# Patient Record
Sex: Male | Born: 1960
Health system: Southern US, Community
[De-identification: ages and names within clinical notes are randomized; demographics above are authoritative.]

## PROBLEM LIST (undated history)

## (undated) DIAGNOSIS — R109 Unspecified abdominal pain: Secondary | ICD-10-CM

## (undated) DIAGNOSIS — K644 Residual hemorrhoidal skin tags: Secondary | ICD-10-CM

## (undated) HISTORY — PX: OTHER SURGICAL HISTORY: SHX169

---

## 2011-11-14 ENCOUNTER — Encounter: Payer: Self-pay | Admitting: *Deleted

## 2011-11-14 ENCOUNTER — Emergency Department (HOSPITAL_BASED_OUTPATIENT_CLINIC_OR_DEPARTMENT_OTHER)
Admission: EM | Admit: 2011-11-14 | Discharge: 2011-11-14 | Disposition: A | Discharge status: Home / Self Care | Payer: BC Managed Care – PPO | Attending: Emergency Medicine | Admitting: Emergency Medicine

## 2011-11-14 DIAGNOSIS — E860 Dehydration: Secondary | ICD-10-CM | POA: Insufficient documentation

## 2011-11-14 DIAGNOSIS — R5381 Other malaise: Secondary | ICD-10-CM | POA: Insufficient documentation

## 2011-11-14 HISTORY — DX: Residual hemorrhoidal skin tags: K64.4

## 2011-11-14 LAB — CBC
MCH: 29.5 pg (ref 26.0–34.0)
MCHC: 36.5 g/dL — ABNORMAL HIGH (ref 30.0–36.0)
MCV: 81 fL (ref 78.0–100.0)
Platelets: 553 10*3/uL — ABNORMAL HIGH (ref 150–400)
RBC: 5.99 MIL/uL — ABNORMAL HIGH (ref 4.22–5.81)
RDW: 13.4 % (ref 11.5–15.5)

## 2011-11-14 LAB — DIFFERENTIAL
Basophils Absolute: 0 10*3/uL (ref 0.0–0.1)
Eosinophils Absolute: 0.6 10*3/uL (ref 0.0–0.7)
Lymphs Abs: 4.5 10*3/uL — ABNORMAL HIGH (ref 0.7–4.0)
Monocytes Absolute: 1.5 10*3/uL — ABNORMAL HIGH (ref 0.1–1.0)
Neutrophils Relative %: 69 % (ref 43–77)

## 2011-11-14 LAB — COMPREHENSIVE METABOLIC PANEL
ALT: 10 U/L (ref 0–53)
AST: 20 U/L (ref 0–37)
Albumin: 1 g/dL — ABNORMAL LOW (ref 3.5–5.2)
CO2: 25 mEq/L (ref 19–32)
Calcium: 6.6 mg/dL — ABNORMAL LOW (ref 8.4–10.5)
Creatinine, Ser: 1.6 mg/dL — ABNORMAL HIGH (ref 0.50–1.35)
GFR calc non Af Amer: 49 mL/min — ABNORMAL LOW (ref >90)
Sodium: 129 mEq/L — ABNORMAL LOW (ref 135–145)
Total Protein: 2.6 g/dL — ABNORMAL LOW (ref 6.0–8.3)

## 2011-11-14 MED ORDER — SODIUM CHLORIDE 0.9 % IV BOLUS (SEPSIS)
1000.0000 mL | Freq: Once | INTRAVENOUS | Status: AC
  Administered 2011-11-14: 1000 mL via INTRAVENOUS

## 2011-11-14 MED ORDER — ONDANSETRON 8 MG PO TBDP: 8.0000 mg | ORAL_TABLET | Freq: Three times a day (TID) | ORAL | Status: AC | PRN

## 2011-11-14 MED ORDER — SODIUM CHLORIDE 0.9 % IV BOLUS (SEPSIS): 1000.0000 mL | Freq: Once | INTRAVENOUS | Status: DC

## 2011-11-14 NOTE — ED Notes (Signed)
Vomiting and diarrhea through the night last pm last diarrhea stool today at 1000 states thinks he may be dehydrated as he has been having cramping in lower legs states this afternoon he had same type cramp in his face which concerned him. Denies pain at present

## 2011-11-14 NOTE — ED Provider Notes (Signed)
History   This chart was scribed for [name] Numbers, MD by Melba Coon. The patient was seen in room MH09/MH09 and the patient's care was started at 6:05PM.    CSN: [id number]  Arrival date & time 11/14/11  1712   First MD Initiated Contact with Patient 11/14/11 1752      Chief Complaint  Patient presents with  . Dehydration  . Fatigue    (Consider location/radiation/quality/duration/timing/severity/associated sxs/prior treatment) HPI [name] [name] is a 50 y.o. male who presents to the Emergency Department complaining of constant, moderate to severe cramping in his face and lower extremities with an onset 10:00AM this morning with associated fatigue and twitching. Pt also complains of dehydration due to vomiting, last time was last night, and diarrhea, last time was this morning. Pt used heating pads for cramping which alleviated the pain. IV fluids before exam also alleviated the pain. Pt has been recently suffering with hemorrhoids, but no recent bleeding. Swelling was present from hemorrhoids; pt used cortisone cream for swelling and heating pads which made it better. No past abdominal surgeries. Patient does have a history of IBS and reports that his symptoms are consistent with prior flares of this. He is here mainly because he was concerned about dehydration.  PCP: Dr. Bosie Clos      Past Medical History  Diagnosis Date  . Hemorrhoids, external     History reviewed. No pertinent past surgical history.  History reviewed. No pertinent family history.  History  Substance Use Topics  . Smoking status: Never Smoker   . Smokeless tobacco: Not on file  . Alcohol Use: No      Review of Systems 10 Systems reviewed and are negative for acute change except as noted in the HPI.  Allergies  Sunflowerseed oil; Other; and Sulfa antibiotics  Home Medications   Current Outpatient Rx  Name Route Sig Dispense Refill  . DIGESTIVE ENZYMES PO Oral Take 1 tablet by mouth 3 (three)  times daily.      Marland Kitchen HYDROCORTISONE 1 % EX OINT Topical Apply topically 2 (two) times daily.      Marland Kitchen POLYETHYLENE GLYCOL 3350 PO POWD Oral Take 17 g by mouth daily.      Marland Kitchen PROBIOTIC FORMULA PO Oral Take 1 tablet by mouth daily.      Marland Kitchen SALINE NASAL SPRAY 0.65 % NA SOLN Nasal Place 1 spray into the nose daily.        BP 150/99  Pulse 103  Temp(Src) 98.1 F (36.7 C) (Oral)  Resp 20  SpO2 100%  Physical Exam  Nursing note and vitals reviewed. Constitutional: He is oriented to person, place, and time. He appears well-developed and well-nourished. No distress.  HENT:  Head: Normocephalic and atraumatic.  Eyes: Conjunctivae and EOM are normal. Pupils are equal, round, and reactive to light.  Neck: Normal range of motion. No tracheal deviation present.  Cardiovascular: Normal rate, regular rhythm and normal heart sounds.   Pulmonary/Chest: Effort normal and breath sounds normal. No respiratory distress.  Abdominal: Soft. He exhibits no distension. There is tenderness. There is no rebound and no guarding.       Mild diffuse tenderness  Musculoskeletal: Normal range of motion. He exhibits no edema.  Neurological: He is alert and oriented to person, place, and time. No sensory deficit.  Skin: Skin is warm and dry. No rash noted.  Psychiatric: He has a normal mood and affect. His behavior is normal.    ED Course  Procedures (including critical care time)  DIAGNOSTIC STUDIES: Oxygen Saturation is 100% on room air, normal by my interpretation.    COORDINATION OF CARE:     Labs Reviewed  CBC - Abnormal; Notable for the following:    WBC 21.2 (*)    RBC 5.99 (*)    Hemoglobin 17.7 (*)    MCHC 36.5 (*)    Platelets 553 (*)    All other components within normal limits  DIFFERENTIAL - Abnormal; Notable for the following:    Neutro Abs 14.6 (*)    Lymphs Abs 4.5 (*)    Monocytes Absolute 1.5 (*)    All other components within normal limits  COMPREHENSIVE METABOLIC PANEL - Abnormal;  Notable for the following:    Sodium 129 (*)    Glucose, Bld 112 (*)    BUN 29 (*)    Creatinine, Ser 1.60 (*)    Calcium 6.6 (*)    Total Protein 2.6 (*)    Albumin 1.0 (*)    Total Bilirubin 0.2 (*)    GFR calc non Af Amer 49 (*)    GFR calc Af Amer 57 (*)    All other components within normal limits   No results found.   1. Dehydration       MDM  Patient was evaluated by myself. He did appear to be dehydrated. Patient improved with 2 L of normal saline IV bolus. He was also given Zofran IV. Patient was notified of his lab results. We discussed that he did have obvious dehydration. He also may have a leukocytosis related to this as well as his IBS flare. The patient definitely does not have inflammatory bowel disease. Patient I discussed that he did not have abdominal pain following rehydration. He was aware that he had a leukocytosis. Based on her discussion the patient preferred no further testing including imaging. He was comfortable with plan for discharge home with a prescription for Zofran and instructions to drink lots of fluids. Patient had a creatinine of 1.6 and no prior values were available. Patient was hyponatremic but this was prior to any rehydration. Patient preferred not to receive his third liter of normal saline IV bolus. Patient was told if he had any worsening of his symptoms or other concerns he was welcome to return. Also he reported that his hemorrhoids her baseline. I did advise using Epsom salts to assess for the symptoms. He preferred not to have rectal exam today given his pain.  I personally performed the services described in this documentation, which was scribed in my presence. The recorded information has been reviewed and considered.          [name] Numbers, MD 11/14/11 2233

## 2011-11-18 DIAGNOSIS — E8809 Other disorders of plasma-protein metabolism, not elsewhere classified: Secondary | ICD-10-CM | POA: Insufficient documentation

## 2011-11-18 DIAGNOSIS — R609 Edema, unspecified: Secondary | ICD-10-CM | POA: Insufficient documentation

## 2011-11-18 DIAGNOSIS — E871 Hypo-osmolality and hyponatremia: Secondary | ICD-10-CM | POA: Insufficient documentation

## 2011-11-18 DIAGNOSIS — M7989 Other specified soft tissue disorders: Secondary | ICD-10-CM | POA: Insufficient documentation

## 2011-11-18 DIAGNOSIS — Z79899 Other long term (current) drug therapy: Secondary | ICD-10-CM | POA: Insufficient documentation

## 2011-11-19 ENCOUNTER — Encounter (HOSPITAL_BASED_OUTPATIENT_CLINIC_OR_DEPARTMENT_OTHER): Payer: Self-pay | Admitting: Emergency Medicine

## 2011-11-19 ENCOUNTER — Emergency Department (HOSPITAL_BASED_OUTPATIENT_CLINIC_OR_DEPARTMENT_OTHER)
Admission: EM | Admit: 2011-11-19 | Discharge: 2011-11-19 | Disposition: A | Discharge status: Home / Self Care | Payer: BC Managed Care – PPO | Attending: Emergency Medicine | Admitting: Emergency Medicine

## 2011-11-19 DIAGNOSIS — E8809 Other disorders of plasma-protein metabolism, not elsewhere classified: Secondary | ICD-10-CM

## 2011-11-19 DIAGNOSIS — E871 Hypo-osmolality and hyponatremia: Secondary | ICD-10-CM

## 2011-11-19 DIAGNOSIS — R609 Edema, unspecified: Secondary | ICD-10-CM

## 2011-11-19 LAB — COMPREHENSIVE METABOLIC PANEL
ALT: 17 U/L (ref 0–53)
Alkaline Phosphatase: 41 U/L (ref 39–117)
BUN: 23 mg/dL (ref 6–23)
CO2: 25 mEq/L (ref 19–32)
GFR calc Af Amer: 73 mL/min — ABNORMAL LOW (ref >90)
GFR calc non Af Amer: 63 mL/min — ABNORMAL LOW (ref >90)
Glucose, Bld: 90 mg/dL (ref 70–99)
Potassium: 3.8 mEq/L (ref 3.5–5.1)
Sodium: 129 mEq/L — ABNORMAL LOW (ref 135–145)

## 2011-11-19 LAB — DIFFERENTIAL
Basophils Relative: 0 % (ref 0–1)
Eosinophils Relative: 30 % — ABNORMAL HIGH (ref 0–5)
Lymphocytes Relative: 29 % (ref 12–46)
Monocytes Absolute: 1 10*3/uL (ref 0.1–1.0)
Neutro Abs: 6.8 10*3/uL (ref 1.7–7.7)
Neutrophils Relative %: 36 % — ABNORMAL LOW (ref 43–77)

## 2011-11-19 LAB — CBC
HCT: 39.6 % (ref 39.0–52.0)
Hemoglobin: 14.3 g/dL (ref 13.0–17.0)
MCH: 29.4 pg (ref 26.0–34.0)
RBC: 4.87 MIL/uL (ref 4.22–5.81)

## 2011-11-19 LAB — URINALYSIS, ROUTINE W REFLEX MICROSCOPIC
Bilirubin Urine: NEGATIVE
Hgb urine dipstick: NEGATIVE
Ketones, ur: NEGATIVE mg/dL
Protein, ur: NEGATIVE mg/dL
Urobilinogen, UA: 0.2 mg/dL (ref 0.0–1.0)

## 2011-11-19 LAB — PRO B NATRIURETIC PEPTIDE: Pro B Natriuretic peptide (BNP): 108.5 pg/mL (ref 0–125)

## 2011-11-19 MED ORDER — FUROSEMIDE 40 MG PO TABS
40.0000 mg | ORAL_TABLET | Freq: Once | ORAL | Status: AC
  Administered 2011-11-19: 40 mg via ORAL
  Filled 2011-11-19: qty 1

## 2011-11-19 NOTE — ED Provider Notes (Signed)
History     CSN: [id number]  Arrival date & time 11/18/11  2351   First MD Initiated Contact with Patient 11/19/11 0147      Chief Complaint  Patient presents with  . Leg Swelling  . Groin Swelling    (Consider location/radiation/quality/duration/timing/severity/associated sxs/prior treatment) HPI  50yoM presents with bilateral lower shin edema. The patient states that he woke up this morning with some swelling in his bilateral lower extremities as well as his penis. He states that the swelling seemed to improve and then got worse as the day progressed. No history of similar. He was recently seen in the emergency department for diarrhea and was given 2 L of IV fluid at that time. He states that he has been drinking "a lot" of fluid and has been on a liquid diet. Has also had multiple meals consisting of canned soups and broth since his discharge. He denies chest pain, shortness of breath. He was noted to have a creatinine of 1.6 during last evaluation. Denies h/o VTE in self or family. No recent hosp/surg/immob. No h/o cancer. Denies exogenous hormone use. Denies h/o CHF.    ED Notes, ED Provider Notes from 11/18/11 0000 to 11/19/11 00:02:44       Bufford Buttner, RN 11/19/2011 00:01      Pt c/o edema to lower extremities bilaterally and swelling in genital area. Pt was seen here 12/24 for dehydration and given IV fluids. Pt states swelling started today.     Past Medical History  Diagnosis Date  . Hemorrhoids, external     History reviewed. No pertinent past surgical history.  No family history on file.  History  Substance Use Topics  . Smoking status: Never Smoker   . Smokeless tobacco: Not on file  . Alcohol Use: No    Review of Systems  All other systems reviewed and are negative.  except as noted HPI   Allergies  Sunflowerseed oil; Other; and Sulfa antibiotics  Home Medications   Current Outpatient Rx  Name Route Sig Dispense Refill  . DIGESTIVE  ENZYMES PO Oral Take 1 tablet by mouth 3 (three) times daily.      Marland Kitchen HYDROCORTISONE 1 % EX OINT Topical Apply topically 2 (two) times daily.      Marland Kitchen ONDANSETRON 8 MG PO TBDP Oral Take 1 tablet (8 mg total) by mouth every 8 (eight) hours as needed for nausea. 20 tablet 0  . POLYETHYLENE GLYCOL 3350 PO POWD Oral Take 17 g by mouth daily.      Marland Kitchen PROBIOTIC FORMULA PO Oral Take 1 tablet by mouth daily.      Marland Kitchen SALINE NASAL SPRAY 0.65 % NA SOLN Nasal Place 1 spray into the nose daily.        BP 125/78  Pulse 65  Temp(Src) 97.9 F (36.6 C) (Oral)  Resp 18  SpO2 100%  Physical Exam  Nursing note and vitals reviewed. Constitutional: He is oriented to person, place, and time. He appears well-developed and well-nourished. No distress.  HENT:  Head: Atraumatic.  Mouth/Throat: Oropharynx is clear and moist.  Eyes: Conjunctivae are normal. Pupils are equal, round, and reactive to light.  Neck: Neck supple.  Cardiovascular: Normal rate, regular rhythm, normal heart sounds and intact distal pulses.  Exam reveals no gallop and no friction rub.   No murmur heard. Pulmonary/Chest: Effort normal. No respiratory distress. He has no wheezes. He has no rales.  Abdominal: Soft. Bowel sounds are normal. There is no tenderness. There  is no rebound and no guarding.  Genitourinary:       Minimal swelling noted penis and scrotum bilaterally No scrotal tenderness to palpation or erythema  Musculoskeletal: Normal range of motion. He exhibits edema. He exhibits no tenderness.       3+ pitting edema below the knee bilateral lotion these 1+ pitting edema bilateral thighs  Neurological: He is alert and oriented to person, place, and time.  Skin: Skin is warm and dry.  Psychiatric: He has a normal mood and affect.    ED Course  Procedures (including critical care time)  Labs Reviewed  CBC - Abnormal; Notable for the following:    WBC 19.0 (*)    MCHC 36.1 (*)    All other components within normal limits    DIFFERENTIAL - Abnormal; Notable for the following:    Neutrophils Relative 36 (*)    Eosinophils Relative 30 (*)    Lymphs Abs 5.5 (*)    Eosinophils Absolute 5.7 (*)    All other components within normal limits  COMPREHENSIVE METABOLIC PANEL - Abnormal; Notable for the following:    Sodium 129 (*)    Calcium 7.2 (*)    Total Protein 3.1 (*)    Albumin 1.3 (*)    GFR calc non Af Amer 63 (*)    GFR calc Af Amer 73 (*)    All other components within normal limits  PRO B NATRIURETIC PEPTIDE  URINALYSIS, ROUTINE W REFLEX MICROSCOPIC   No results found.   1. Edema   2. Hypoalbuminemia   3. Hyponatremia     MDM  50yoM with apparent fluid overload. He does not have congestive heart failure history or by labs. He does have hypoalbuminemia and mild hyponatremia question dilutional hypernatremia. Low susp SIADH. In light of his recent boluses of normal saline and largely increased sodium intake. His diarrhea is actually now resolved. He appears well. He had leukocytosis when he was seen here on the 24th to 21,000 which is ersistent but now dec to 19,000. His creatinine has improved from 1.6-1.3. His sodium is 129 which is stable from previous. Albumin slightly higher than previous. No Protein in his urine. The patient appears well here. He states that his symptoms are actually resolving from earlier today. He was given by mouth Lasix and discuss follow up with his primary care physician for hypoalbuminemia as well as the edema.        Forbes Cellar, MD 11/19/11 [phone]

## 2011-11-19 NOTE — ED Notes (Signed)
Pt c/o edema to lower extremities bilaterally and swelling in genital area. Pt was seen here 12/24 for dehydration and given IV fluids. Pt states swelling started today.

## 2011-11-30 ENCOUNTER — Other Ambulatory Visit (HOSPITAL_BASED_OUTPATIENT_CLINIC_OR_DEPARTMENT_OTHER): Payer: Self-pay | Admitting: Family Medicine

## 2011-11-30 DIAGNOSIS — R197 Diarrhea, unspecified: Secondary | ICD-10-CM

## 2011-12-01 ENCOUNTER — Ambulatory Visit (HOSPITAL_BASED_OUTPATIENT_CLINIC_OR_DEPARTMENT_OTHER): Payer: Self-pay

## 2011-12-01 ENCOUNTER — Other Ambulatory Visit (HOSPITAL_BASED_OUTPATIENT_CLINIC_OR_DEPARTMENT_OTHER): Payer: Self-pay | Admitting: Family Medicine

## 2011-12-01 ENCOUNTER — Ambulatory Visit (HOSPITAL_BASED_OUTPATIENT_CLINIC_OR_DEPARTMENT_OTHER)
Admission: RE | Admit: 2011-12-01 | Discharge: 2011-12-01 | Disposition: A | Discharge status: Home / Self Care | Payer: BC Managed Care – PPO | Source: Ambulatory Visit | Attending: Family Medicine | Admitting: Family Medicine

## 2011-12-01 DIAGNOSIS — R197 Diarrhea, unspecified: Secondary | ICD-10-CM

## 2011-12-01 DIAGNOSIS — R109 Unspecified abdominal pain: Secondary | ICD-10-CM | POA: Insufficient documentation

## 2011-12-01 DIAGNOSIS — R188 Other ascites: Secondary | ICD-10-CM | POA: Insufficient documentation

## 2011-12-01 DIAGNOSIS — J90 Pleural effusion, not elsewhere classified: Secondary | ICD-10-CM

## 2011-12-01 DIAGNOSIS — J9819 Other pulmonary collapse: Secondary | ICD-10-CM

## 2011-12-01 DIAGNOSIS — R933 Abnormal findings on diagnostic imaging of other parts of digestive tract: Secondary | ICD-10-CM | POA: Insufficient documentation

## 2011-12-01 MED ORDER — IOHEXOL 300 MG/ML  SOLN
100.0000 mL | Freq: Once | INTRAMUSCULAR | Status: AC | PRN
  Administered 2011-12-01: 100 mL via INTRAVENOUS

## 2012-10-17 ENCOUNTER — Other Ambulatory Visit (HOSPITAL_BASED_OUTPATIENT_CLINIC_OR_DEPARTMENT_OTHER): Payer: Self-pay | Admitting: Internal Medicine

## 2012-10-23 ENCOUNTER — Other Ambulatory Visit: Payer: Self-pay | Admitting: Internal Medicine

## 2012-10-23 DIAGNOSIS — R109 Unspecified abdominal pain: Secondary | ICD-10-CM

## 2012-10-26 ENCOUNTER — Emergency Department (HOSPITAL_COMMUNITY)
Admission: EM | Admit: 2012-10-26 | Discharge: 2012-10-26 | Discharge status: Against Medical Advice | Payer: BC Managed Care – PPO | Attending: Emergency Medicine | Admitting: Emergency Medicine

## 2012-10-26 ENCOUNTER — Ambulatory Visit
Admission: RE | Admit: 2012-10-26 | Discharge: 2012-10-26 | Disposition: A | Discharge status: Home / Self Care | Payer: BC Managed Care – PPO | Source: Ambulatory Visit | Attending: Internal Medicine | Admitting: Internal Medicine

## 2012-10-26 ENCOUNTER — Encounter (HOSPITAL_COMMUNITY): Payer: Self-pay | Admitting: *Deleted

## 2012-10-26 DIAGNOSIS — E860 Dehydration: Secondary | ICD-10-CM | POA: Insufficient documentation

## 2012-10-26 DIAGNOSIS — R112 Nausea with vomiting, unspecified: Secondary | ICD-10-CM | POA: Insufficient documentation

## 2012-10-26 DIAGNOSIS — R197 Diarrhea, unspecified: Secondary | ICD-10-CM | POA: Insufficient documentation

## 2012-10-26 DIAGNOSIS — R4182 Altered mental status, unspecified: Secondary | ICD-10-CM | POA: Insufficient documentation

## 2012-10-26 DIAGNOSIS — R109 Unspecified abdominal pain: Secondary | ICD-10-CM

## 2012-10-26 DIAGNOSIS — F29 Unspecified psychosis not due to a substance or known physiological condition: Secondary | ICD-10-CM | POA: Insufficient documentation

## 2012-10-26 MED ORDER — IOHEXOL 300 MG/ML  SOLN
100.0000 mL | Freq: Once | INTRAMUSCULAR | Status: AC | PRN
  Administered 2012-10-26: 100 mL via INTRAVENOUS

## 2012-10-26 NOTE — ED Notes (Signed)
Pt states today had an abdominal CT scan d/t abdominal problems x 1 year, after drinking the contrast and after scan pt started repeating himself, had nausea/vomiting and diarrhea. Pt states he has been feeling confused.

## 2012-10-26 NOTE — ED Notes (Signed)
Being treated by MD in [facility] for abdominal issues.  Wife states he had a CT abdomen done today.  Pt has been vomiting, repeating himself and feels dehydrated.  Has been having diarrhea as well.  Pt appears a bit ashy and has chills at triage window,.

## 2012-10-26 NOTE — ED Notes (Signed)
Pt states that he drank 3 bottles of water and had a snack while waiting and now feels much better.  No longer wishes to wait and is leaving now.

## 2013-08-19 ENCOUNTER — Encounter (HOSPITAL_BASED_OUTPATIENT_CLINIC_OR_DEPARTMENT_OTHER): Payer: Self-pay

## 2013-08-19 ENCOUNTER — Emergency Department (HOSPITAL_BASED_OUTPATIENT_CLINIC_OR_DEPARTMENT_OTHER): Payer: BC Managed Care – PPO

## 2013-08-19 ENCOUNTER — Inpatient Hospital Stay (HOSPITAL_BASED_OUTPATIENT_CLINIC_OR_DEPARTMENT_OTHER)
Admission: EM | Admit: 2013-08-19 | Discharge: 2013-08-21 | DRG: 183 | Disposition: A | Discharge status: Home / Self Care | Payer: BC Managed Care – PPO | Attending: Internal Medicine | Admitting: Internal Medicine

## 2013-08-19 DIAGNOSIS — R109 Unspecified abdominal pain: Secondary | ICD-10-CM

## 2013-08-19 DIAGNOSIS — IMO0002 Reserved for concepts with insufficient information to code with codable children: Secondary | ICD-10-CM

## 2013-08-19 DIAGNOSIS — G8929 Other chronic pain: Secondary | ICD-10-CM | POA: Diagnosis present

## 2013-08-19 DIAGNOSIS — R066 Hiccough: Secondary | ICD-10-CM | POA: Diagnosis present

## 2013-08-19 DIAGNOSIS — Z91018 Allergy to other foods: Secondary | ICD-10-CM

## 2013-08-19 DIAGNOSIS — Z91010 Allergy to peanuts: Secondary | ICD-10-CM

## 2013-08-19 DIAGNOSIS — Z882 Allergy status to sulfonamides status: Secondary | ICD-10-CM

## 2013-08-19 DIAGNOSIS — K9049 Malabsorption due to intolerance, not elsewhere classified: Secondary | ICD-10-CM | POA: Diagnosis present

## 2013-08-19 DIAGNOSIS — E860 Dehydration: Secondary | ICD-10-CM | POA: Diagnosis present

## 2013-08-19 DIAGNOSIS — K9089 Other intestinal malabsorption: Principal | ICD-10-CM | POA: Diagnosis present

## 2013-08-19 DIAGNOSIS — E8809 Other disorders of plasma-protein metabolism, not elsewhere classified: Secondary | ICD-10-CM | POA: Diagnosis present

## 2013-08-19 DIAGNOSIS — I890 Lymphedema, not elsewhere classified: Secondary | ICD-10-CM | POA: Diagnosis present

## 2013-08-19 HISTORY — DX: Unspecified abdominal pain: R10.9

## 2013-08-19 LAB — COMPREHENSIVE METABOLIC PANEL
AST: 27 U/L (ref 0–37)
Albumin: 2.3 g/dL — ABNORMAL LOW (ref 3.5–5.2)
BUN: 12 mg/dL (ref 6–23)
Calcium: 8.7 mg/dL (ref 8.4–10.5)
Chloride: 103 mEq/L (ref 96–112)
Creatinine, Ser: 1 mg/dL (ref 0.50–1.35)
Glucose, Bld: 129 mg/dL — ABNORMAL HIGH (ref 70–99)
Total Bilirubin: 0.6 mg/dL (ref 0.3–1.2)
Total Protein: 4.9 g/dL — ABNORMAL LOW (ref 6.0–8.3)

## 2013-08-19 LAB — URINE MICROSCOPIC-ADD ON

## 2013-08-19 LAB — CBC WITH DIFFERENTIAL/PLATELET
Basophils Absolute: 0 10*3/uL (ref 0.0–0.1)
Basophils Relative: 0 % (ref 0–1)
Eosinophils Absolute: 0 10*3/uL (ref 0.0–0.7)
Eosinophils Relative: 0 % (ref 0–5)
HCT: 41.3 % (ref 39.0–52.0)
Hemoglobin: 14.1 g/dL (ref 13.0–17.0)
Lymphs Abs: 1.4 10*3/uL (ref 0.7–4.0)
MCH: 28.3 pg (ref 26.0–34.0)
MCHC: 34.1 g/dL (ref 30.0–36.0)
Monocytes Absolute: 0.5 10*3/uL (ref 0.1–1.0)
Monocytes Relative: 4 % (ref 3–12)
Neutro Abs: 13.3 10*3/uL — ABNORMAL HIGH (ref 1.7–7.7)
RDW: 13.8 % (ref 11.5–15.5)
WBC: 15.3 10*3/uL — ABNORMAL HIGH (ref 4.0–10.5)

## 2013-08-19 LAB — URINALYSIS, ROUTINE W REFLEX MICROSCOPIC
Glucose, UA: NEGATIVE mg/dL
Ketones, ur: 40 mg/dL — AB
Leukocytes, UA: NEGATIVE
Protein, ur: 100 mg/dL — AB
Specific Gravity, Urine: 1.019 (ref 1.005–1.030)
Urobilinogen, UA: 0.2 mg/dL (ref 0.0–1.0)
pH: 8.5 — ABNORMAL HIGH (ref 5.0–8.0)

## 2013-08-19 LAB — LIPASE, BLOOD: Lipase: 42 U/L (ref 11–59)

## 2013-08-19 MED ORDER — PROMETHAZINE HCL 25 MG/ML IJ SOLN
INTRAMUSCULAR | Status: AC
  Filled 2013-08-19: qty 1

## 2013-08-19 MED ORDER — METOCLOPRAMIDE HCL 5 MG/ML IJ SOLN
10.0000 mg | Freq: Once | INTRAMUSCULAR | Status: AC
  Administered 2013-08-19: 10 mg via INTRAVENOUS

## 2013-08-19 MED ORDER — PROMETHAZINE HCL 25 MG/ML IJ SOLN: 25.0000 mg | Freq: Once | INTRAMUSCULAR | Status: DC

## 2013-08-19 MED ORDER — PROMETHAZINE HCL 25 MG/ML IJ SOLN
12.5000 mg | Freq: Once | INTRAMUSCULAR | Status: AC
  Administered 2013-08-19: 12.5 mg via INTRAVENOUS

## 2013-08-19 MED ORDER — IOHEXOL 300 MG/ML  SOLN: 100.0000 mL | Freq: Once | INTRAMUSCULAR | Status: AC | PRN

## 2013-08-19 MED ORDER — MORPHINE SULFATE 4 MG/ML IJ SOLN
4.0000 mg | Freq: Once | INTRAMUSCULAR | Status: AC
  Administered 2013-08-19: 4 mg via INTRAVENOUS
  Filled 2013-08-19: qty 1

## 2013-08-19 MED ORDER — ONDANSETRON HCL 4 MG/2ML IJ SOLN
4.0000 mg | Freq: Once | INTRAMUSCULAR | Status: AC
  Administered 2013-08-19: 4 mg via INTRAVENOUS
  Filled 2013-08-19: qty 2

## 2013-08-19 MED ORDER — HYDROMORPHONE HCL PF 1 MG/ML IJ SOLN
1.0000 mg | Freq: Once | INTRAMUSCULAR | Status: DC
  Filled 2013-08-19: qty 1

## 2013-08-19 MED ORDER — SODIUM CHLORIDE 0.9 % IV SOLN
INTRAVENOUS | Status: AC
  Administered 2013-08-19: 22:00:00 via INTRAVENOUS

## 2013-08-19 MED ORDER — METOCLOPRAMIDE HCL 5 MG/ML IJ SOLN
INTRAMUSCULAR | Status: AC
  Filled 2013-08-19: qty 2

## 2013-08-19 MED ORDER — ONDANSETRON HCL 4 MG/2ML IJ SOLN: 4.0000 mg | Freq: Three times a day (TID) | INTRAMUSCULAR | Status: DC | PRN

## 2013-08-19 MED ORDER — SODIUM CHLORIDE 0.9 % IV BOLUS (SEPSIS)
1000.0000 mL | Freq: Once | INTRAVENOUS | Status: AC
  Administered 2013-08-19 (×2): 1000 mL via INTRAVENOUS

## 2013-08-19 MED ORDER — FENTANYL CITRATE 0.05 MG/ML IJ SOLN
50.0000 ug | Freq: Once | INTRAMUSCULAR | Status: AC
  Administered 2013-08-19: 50 ug via INTRAVENOUS
  Filled 2013-08-19: qty 2

## 2013-08-19 MED ORDER — IOHEXOL 300 MG/ML  SOLN
50.0000 mL | Freq: Once | INTRAMUSCULAR | Status: AC | PRN
  Administered 2013-08-19: 50 mL via ORAL

## 2013-08-19 NOTE — ED Notes (Signed)
Pt reports abdominal pain and vomiting since this morning.

## 2013-08-19 NOTE — ED Provider Notes (Signed)
CSN: [id number]     Arrival date & time 08/19/13  1621 History  This chart was scribed for Hilario Quarry, MD by Quintella Reichert, ED scribe.  This patient was seen in room MH12/MH12 and the patient's care was started at 5:59 PM.  Chief Complaint  Patient presents with  . Abdominal Pain  . Emesis    The history is provided by the patient. No language interpreter was used.    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  Pt localizes pain to upper abdomen and describes it as a moderate-to-severe, waxing-and-waning "clenching" pain.  He also complains of an episode of bilious emesis this afternoon.  He denies any other emesis but does have some belching and associated nausea.  He was able to eat half of a bagel this morning without vomiting.   He has not had anything else to eat and has been drinking very little.  He had a normal BM several hours ago.  Pt states he has experienced similar episodes recurrently for some time but his current episode is more severe and persistent.  He notes that his flare-ups are usually caused by eating fatty foods and are typically controlled by diet.  He denies any recent lapses in his diet.  He also medicates regularly with Prilosec.  He denies any other chronic medical conditions or regular medication usage.  He is allergic to Sulfa.  He denies alcohol or tobacco use.   He denies h/o kidney stones   PCP is Dr. Marinda Elk GI is Dr. Loreli Dollar at [facility]   Past Medical History  Diagnosis Date  . Hemorrhoids, external   . Abdominal pain     Past Surgical History  Procedure Laterality Date  . Laproscopy      No family history on file.   History  Substance Use Topics  . Smoking status: Never Smoker   . Smokeless tobacco: Never Used  . Alcohol Use: No     Review of  Systems  Gastrointestinal: Positive for vomiting and abdominal pain.  All other systems reviewed and are negative.     Allergies  Sunflowerseed oil; Other; and Sulfa antibiotics  Home Medications   Current Outpatient Rx  Name  Route  Sig  Dispense  Refill  . omeprazole (PRILOSEC) 20 MG capsule   Oral   Take 20 mg by mouth daily.          BP 152/95  Pulse 72  Resp 18  Ht 5\' 10"  (1.778 m)  Wt 140 lb (63.504 kg)  BMI 20.09 kg/m2  SpO2 100%  Physical Exam  Nursing note and vitals reviewed. Constitutional: He is oriented to person, place, and time. He appears well-developed and well-nourished.  HENT:  Head: Normocephalic and atraumatic.  Right Ear: External ear normal.  Left Ear: External ear normal.  Nose: Nose normal.  Mouth/Throat: Oropharynx is clear and moist.  Eyes: Conjunctivae and EOM are normal. Pupils are equal, round, and reactive to light.  Neck: Normal range of motion. Neck supple.  Cardiovascular: Normal rate, regular rhythm, normal heart sounds and intact distal pulses.   Pulmonary/Chest: Effort normal and breath sounds normal. No respiratory distress. He has no wheezes. He exhibits no tenderness.  Abdominal: Soft. Bowel sounds are normal. He exhibits no distension and no mass. There is tenderness. There is no  guarding.  Diffuse tenderness to abdomen, more to epigastrium, RUQ and RLQ  Musculoskeletal: Normal range of motion.  Neurological: He is alert and oriented to person, place, and time. He has normal reflexes. He exhibits normal muscle tone. Coordination normal.  Skin: Skin is warm and dry.  Psychiatric: He has a normal mood and affect. His behavior is normal. Judgment and thought content normal.    ED Course  Procedures (including critical care time)  DIAGNOSTIC STUDIES: Oxygen Saturation is 100% on room air, normal by my interpretation.    COORDINATION OF CARE: 6:07 PM-Discussed treatment plan which includes pain medication, anti-emetics, labs  and imaging with pt at bedside and pt agreed to plan.    Labs Review Labs Reviewed  CBC WITH DIFFERENTIAL - Abnormal; Notable for the following:    WBC 15.3 (*)    Platelets 498 (*)    Neutrophils Relative % 87 (*)    Neutro Abs 13.3 (*)    Lymphocytes Relative 9 (*)    All other components within normal limits  COMPREHENSIVE METABOLIC PANEL - Abnormal; Notable for the following:    Glucose, Bld 129 (*)    Total Protein 4.9 (*)    Albumin 2.3 (*)    GFR calc non Af Amer 85 (*)    All other components within normal limits  URINALYSIS, ROUTINE W REFLEX MICROSCOPIC - Abnormal; Notable for the following:    pH 8.5 (*)    Ketones, ur 40 (*)    Protein, ur 100 (*)    All other components within normal limits  URINE MICROSCOPIC-ADD ON - Abnormal; Notable for the following:    Bacteria, UA FEW (*)    Casts HYALINE CASTS (*)    All other components within normal limits  URINE CULTURE  LIPASE, BLOOD    Imaging Review Ct Abdomen Pelvis W Contrast  08/19/2013   CLINICAL DATA:  Abdominal pain.  History of Waldmanns disease.  EXAM: CT ABDOMEN AND PELVIS WITH CONTRAST  TECHNIQUE: Multidetector CT imaging of the abdomen and pelvis was performed using the standard protocol following bolus administration of intravenous contrast.  CONTRAST:  50mL OMNIPAQUE IOHEXOL 300 MG/ML  SOLN  COMPARISON:  CT scan 10/26/2012.  FINDINGS: The lung bases are clear. The heart is normal in size. No pericardial effusion. Small hiatal hernia is noted.  Mild diffuse fatty infiltration of the liver with a few small scattered stable hepatic cysts. No worrisome hepatic lesions or intrahepatic biliary dilatation. The portal and hepatic veins are patent. The gallbladder is normal. No common bile duct dilatation. The pancreas is normal. The spleen is normal. The adrenal glands and kidneys are normal.  The stomach and duodenum are unremarkable. The ileal loops of small bowel demonstrate fairly significant dilatation with wall  thickening and submucosal edema. No findings for obstruction. The jejunal loops appear relatively normal with normal mucosal fold pattern. There is also moderate abdominal and pelvic ascites and this may suggest hypoalbuminemia. The colon is unremarkable. Scattered mesenteric and retroperitoneal lymph nodes but no mass or adenopathy. A small periumbilical hernia is noted containing fat. The bladder, prostate gland and seminal vesicles are unremarkable. No inguinal mass or adenopathy.  The bony structures are unremarkable.  IMPRESSION: Ileal small bowel distention and wall thickening with submucosal edema. This could be secondary to ileal inflammatory process. Similar appearance on the prior examination. No definite obstruction.  Moderate abdominal/pelvic ascites.   Electronically Signed   By: Loralie Champagne M.D.   On: 08/19/2013 20:10  MDM  No diagnosis found. 52 year old male with a history of lymphangiectasia presents today complaining of abdominal pain consistent with prior similar episodes but lasting all day today. He has waves of pain and is nauseated. His white blood cell count is elevated at 15,000. CT reveals small bowel distention and wall thickening with submucosal edema which could be secondary to an inflammatory process. He continues to have pain here with IV pain medicine, anti-emetics, and Reglan.  Patient care discussed with Dr. Conley Rolls and he will accept patient in transfer.  Patient with vital signs remaining stable and feels improved after ms.   Hilario Quarry, MD 08/19/13 [phone]

## 2013-08-19 NOTE — Progress Notes (Signed)
PENDING ACCEPTANCE TRANFER NOTE:  Call received from:   Dr Rosalia Hammers at [facility].  REASON FOR REQUESTING TRANSFER:  Abdominal pain.  HPI:  52 yo with hx of Waldmann's disease, presents to ER with exacerbation of chronic abdominal pain.  CT showed no definite obstruction.  There were ileal small bowel distention and wall thickening.  WBC 15K.  Requesting symptomatic treatments.    PLAN:  According to telephone report, this patient was accepted for transfer to general medical floor,   Under [facility] team:  10,  I have requested an order be written to call Flow Manager at [phone] upon patient arrival to the floor for final physician assignment who will do the admission and give admitting orders.  SIGNEDHouston Siren, MD [facility]  08/19/2013, 10:44 PM

## 2013-08-20 ENCOUNTER — Encounter (HOSPITAL_COMMUNITY): Payer: Self-pay | Admitting: *Deleted

## 2013-08-20 ENCOUNTER — Encounter (HOSPITAL_COMMUNITY)
Admission: EM | Disposition: A | Discharge status: Home / Self Care | Payer: Self-pay | Source: Home / Self Care | Attending: Internal Medicine

## 2013-08-20 DIAGNOSIS — E8809 Other disorders of plasma-protein metabolism, not elsewhere classified: Secondary | ICD-10-CM | POA: Diagnosis present

## 2013-08-20 DIAGNOSIS — K9089 Other intestinal malabsorption: Principal | ICD-10-CM

## 2013-08-20 DIAGNOSIS — E860 Dehydration: Secondary | ICD-10-CM | POA: Diagnosis present

## 2013-08-20 DIAGNOSIS — R109 Unspecified abdominal pain: Secondary | ICD-10-CM

## 2013-08-20 DIAGNOSIS — K9049 Malabsorption due to intolerance, not elsewhere classified: Secondary | ICD-10-CM | POA: Diagnosis present

## 2013-08-20 DIAGNOSIS — IMO0002 Reserved for concepts with insufficient information to code with codable children: Secondary | ICD-10-CM

## 2013-08-20 DIAGNOSIS — R066 Hiccough: Secondary | ICD-10-CM

## 2013-08-20 LAB — URINE CULTURE
Colony Count: NO GROWTH
Culture: NO GROWTH

## 2013-08-20 LAB — VITAMIN B12: Vitamin B-12: 413 pg/mL (ref 211–911)

## 2013-08-20 SURGERY — EGD (ESOPHAGOGASTRODUODENOSCOPY)
Anesthesia: Moderate Sedation

## 2013-08-20 MED ORDER — CHLORPROMAZINE HCL 50 MG PO TABS
50.0000 mg | ORAL_TABLET | Freq: Three times a day (TID) | ORAL | Status: DC | PRN
  Filled 2013-08-20 (×2): qty 1

## 2013-08-20 MED ORDER — ONDANSETRON HCL 4 MG/2ML IJ SOLN: 4.0000 mg | Freq: Four times a day (QID) | INTRAMUSCULAR | Status: DC | PRN

## 2013-08-20 MED ORDER — ONDANSETRON HCL 4 MG PO TABS: 4.0000 mg | ORAL_TABLET | Freq: Four times a day (QID) | ORAL | Status: DC | PRN

## 2013-08-20 MED ORDER — HYDROMORPHONE HCL PF 1 MG/ML IJ SOLN
0.5000 mg | INTRAMUSCULAR | Status: DC | PRN
  Administered 2013-08-20: 0.5 mg via INTRAVENOUS
  Filled 2013-08-20: qty 1

## 2013-08-20 MED ORDER — HEPARIN SODIUM (PORCINE) 5000 UNIT/ML IJ SOLN
5000.0000 [IU] | Freq: Three times a day (TID) | INTRAMUSCULAR | Status: DC
  Administered 2013-08-20 – 2013-08-21 (×2): 5000 [IU] via SUBCUTANEOUS
  Filled 2013-08-20 (×5): qty 1

## 2013-08-20 MED ORDER — DEXTROSE-NACL 5-0.9 % IV SOLN
INTRAVENOUS | Status: DC
  Administered 2013-08-20 (×2): via INTRAVENOUS
  Administered 2013-08-21: 07:00:00 1000 mL via INTRAVENOUS

## 2013-08-20 MED ORDER — BOOST / RESOURCE BREEZE PO LIQD: 1.0000 | Freq: Three times a day (TID) | ORAL | Status: DC

## 2013-08-20 MED ORDER — PRO-STAT SUGAR FREE PO LIQD
30.0000 mL | Freq: Two times a day (BID) | ORAL | Status: DC
  Filled 2013-08-20 (×3): qty 30

## 2013-08-20 MED ORDER — BOOST / RESOURCE BREEZE PO LIQD: 1.0000 | Freq: Two times a day (BID) | ORAL | Status: DC

## 2013-08-20 MED ORDER — HYDROCORTISONE SOD SUCCINATE 100 MG IJ SOLR
50.0000 mg | Freq: Three times a day (TID) | INTRAMUSCULAR | Status: AC
  Administered 2013-08-20 (×3): 50 mg via INTRAVENOUS
  Filled 2013-08-20 (×3): qty 1

## 2013-08-20 MED ORDER — BENEPROTEIN PO POWD
2.0000 | Freq: Three times a day (TID) | ORAL | Status: DC
  Administered 2013-08-20: 12 g via ORAL
  Filled 2013-08-20: qty 227

## 2013-08-20 MED ORDER — PANTOPRAZOLE SODIUM 40 MG PO TBEC
40.0000 mg | DELAYED_RELEASE_TABLET | Freq: Every day | ORAL | Status: DC
  Administered 2013-08-20 – 2013-08-21 (×2): 40 mg via ORAL
  Filled 2013-08-20 (×2): qty 1

## 2013-08-20 NOTE — Progress Notes (Signed)
Please see Consult note.  Time spent for this consult, including reviewing office records from [facility], patient's current hospital records, discussing management with patient and wife, reviewing alternatives in treatment and options for referral, and coordinating care with attending hospitalist and nurse was approximately 45 minutes.  Florencia Reasons, M.D. [phone]

## 2013-08-20 NOTE — H&P (Signed)
[facility] History and Physical  Stockton Nunley ZOX:[id number] DOB: 1961-08-27    PCP:   Wayne Sever at [facility]. Local GI:  Dr Bosie Clos.  Chief Complaint: acute on chronic abdominal pain.  HPI: [name] [name] is an 52 y.o. male with hx of protein losing enteropathy (Waldman's disease), with intermittent abdominal about once a month, lasting only about an hour, presents to [facility] with abdominal pain lasting all day along with hiccups, nausea and vomiting.  He has been on a strict diet and was able to control his abdominal pain not requiring chronic narcotics.  He has been on steroids for quite sometimes, but has been off for several months.  He has no black or bloody stools.  Evalaution in the ER included an abdominal pelvic CT which showed ileal inflammation, but no definite obtruction.  He has normal electrolytes, liver fx tests, normal renal fx tests, but has leukocytosis with WBC of 15K.  Hospitalist was asked to admit him for symptomatic treatment of his nausea, vomiting, and abdominal pain.  Rewiew of Systems:  Constitutional: Negative for malaise, fever and chills. No significant weight loss or weight gain Eyes: Negative for eye pain, redness and discharge, diplopia, visual changes, or flashes of light. ENMT: Negative for ear pain, hoarseness, nasal congestion, sinus pressure and sore throat. No headaches; tinnitus, drooling, or problem swallowing. Cardiovascular: Negative for chest pain, palpitations, diaphoresis, dyspnea and peripheral edema. ; No orthopnea, PND Respiratory: Negative for cough, hemoptysis, wheezing and stridor. No pleuritic chestpain. Gastrointestinal: Negative for diarrhea,  melena, blood in stool, hematemesis, jaundice and rectal bleeding.    Genitourinary: Negative for frequency, dysuria, incontinence,flank pain and hematuria; Musculoskeletal: Negative for back pain and neck pain. Negative for swelling and trauma.;  Skin: . Negative for pruritus, rash,  abrasions, bruising and skin lesion.; ulcerations Neuro: Negative for headache, lightheadedness and neck stiffness. Negative for weakness, altered level of consciousness , altered mental status, extremity weakness, burning feet, involuntary movement, seizure and syncope.  Psych: negative for anxiety, depression, insomnia, tearfulness, panic attacks, hallucinations, paranoia, suicidal or homicidal ideation    Past Medical History  Diagnosis Date  . Hemorrhoids, external   . Abdominal pain     Past Surgical History  Procedure Laterality Date  . Laproscopy      Medications:  HOME MEDS: Prior to Admission medications   Medication Sig Start Date End Date Taking? Authorizing Provider  omeprazole (PRILOSEC) 20 MG capsule Take 20 mg by mouth daily.   Yes Historical Provider, MD     Allergies:  Allergies  Allergen Reactions  . Sunflowerseed Oil Anaphylaxis  . Other     Cantaloupe, Apple, Plum Reaction: tingling in mouth  . Sulfa Antibiotics Rash    Social History:   reports that he has never smoked. He has never used smokeless tobacco. He reports that he does not drink alcohol or use illicit drugs.  Family History: History reviewed. No pertinent family history.   Physical Exam: Filed Vitals:   08/19/13 1630 08/20/13 0025 08/20/13 0120  BP: 152/95 144/73 159/92  Pulse: 72 60 66  Temp:  98.2 F (36.8 C) 98.2 F (36.8 C)  TempSrc: Oral Oral Oral  Resp: 18 18 18   Height: 5\' 10"  (1.778 m)  5' 1.2" (1.554 m)  Weight: 63.504 kg (140 lb)  64.9 kg (143 lb 1.3 oz)  SpO2: 100% 98% 97%   Blood pressure 159/92, pulse 66, temperature 98.2 F (36.8 C), temperature source Oral, resp. rate 18, height 5' 1.2" (1.554 m), weight  64.9 kg (143 lb 1.3 oz), SpO2 97.00%.  GEN:  Pleasant  patient lying in the stretcher in no acute distress; cooperative with exam. PSYCH:  alert and oriented x4; does not appear anxious or depressed; affect is appropriate. HEENT: Mucous membranes pink and  anicteric; PERRLA; EOM intact; no cervical lymphadenopathy nor thyromegaly or carotid bruit; no JVD; There were no stridor. Neck is very supple. Breasts:: Not examined CHEST WALL: No tenderness CHEST: Normal respiration, clear to auscultation bilaterally.  HEART: Regular rate and rhythm.  There are no murmur, rub, or gallops.   BACK: No kyphosis or scoliosis; no CVA tenderness ABDOMEN: soft and slightly tender; no masses, no organomegaly, normal abdominal bowel sounds; no pannus; no intertriginous candida. There is no rebound and no distention. Rectal Exam: Not done EXTREMITIES: No bone or joint deformity; age-appropriate arthropathy of the hands and knees; no edema; no ulcerations.  There is no calf tenderness. Genitalia: not examined PULSES: 2+ and symmetric SKIN: Normal hydration no rash or ulceration CNS: Cranial nerves 2-12 grossly intact no focal lateralizing neurologic deficit.  Speech is fluent; uvula elevated with phonation, facial symmetry and tongue midline. DTR are normal bilaterally, cerebella exam is intact, barbinski is negative and strengths are equaled bilaterally.  No sensory loss.   Labs on Admission:  Basic Metabolic Panel:  Recent Labs Lab 08/19/13 1638  NA 138  K 3.7  CL 103  CO2 24  GLUCOSE 129*  BUN 12  CREATININE 1.00  CALCIUM 8.7   Liver Function Tests:  Recent Labs Lab 08/19/13 1638  AST 27  ALT 12  ALKPHOS 87  BILITOT 0.6  PROT 4.9*  ALBUMIN 2.3*    Recent Labs Lab 08/19/13 1819  LIPASE 42   No results found for this basename: AMMONIA,  in the last 168 hours CBC:  Recent Labs Lab 08/19/13 1638  WBC 15.3*  NEUTROABS 13.3*  HGB 14.1  HCT 41.3  MCV 82.9  PLT 498*   Cardiac Enzymes: No results found for this basename: CKTOTAL, CKMB, CKMBINDEX, TROPONINI,  in the last 168 hours  CBG: No results found for this basename: GLUCAP,  in the last 168 hours   Radiological Exams on Admission: Ct Abdomen Pelvis W Contrast  08/19/2013    CLINICAL DATA:  Abdominal pain.  History of Waldmanns disease.  EXAM: CT ABDOMEN AND PELVIS WITH CONTRAST  TECHNIQUE: Multidetector CT imaging of the abdomen and pelvis was performed using the standard protocol following bolus administration of intravenous contrast.  CONTRAST:  50mL OMNIPAQUE IOHEXOL 300 MG/ML  SOLN  COMPARISON:  CT scan 10/26/2012.  FINDINGS: The lung bases are clear. The heart is normal in size. No pericardial effusion. Small hiatal hernia is noted.  Mild diffuse fatty infiltration of the liver with a few small scattered stable hepatic cysts. No worrisome hepatic lesions or intrahepatic biliary dilatation. The portal and hepatic veins are patent. The gallbladder is normal. No common bile duct dilatation. The pancreas is normal. The spleen is normal. The adrenal glands and kidneys are normal.  The stomach and duodenum are unremarkable. The ileal loops of small bowel demonstrate fairly significant dilatation with wall thickening and submucosal edema. No findings for obstruction. The jejunal loops appear relatively normal with normal mucosal fold pattern. There is also moderate abdominal and pelvic ascites and this may suggest hypoalbuminemia. The colon is unremarkable. Scattered mesenteric and retroperitoneal lymph nodes but no mass or adenopathy. A small periumbilical hernia is noted containing fat. The bladder, prostate gland and seminal vesicles are  unremarkable. No inguinal mass or adenopathy.  The bony structures are unremarkable.  IMPRESSION: Ileal small bowel distention and wall thickening with submucosal edema. This could be secondary to ileal inflammatory process. Similar appearance on the prior examination. No definite obstruction.  Moderate abdominal/pelvic ascites.   Electronically Signed   By: Loralie Champagne M.D.   On: 08/19/2013 20:10    Assessment/Plan Present on Admission:  . Protein-losing enteropathy . Hypoalbuminemia . Dehydration  PLAN:  Will admit him for symptomatic  treatments to include analgesics, antiemetics, and antipsychotic for intractible hiccups PRN.  Will give some IVF, but avoid too much, since his albumin is low.  He will be made NPO at this time.  Please consult Dr Charlott Rakes later today for further recommendation.  Because of his chronic steroid use, I will give stress dose steroids, but will defer further steroid treatment to GI.  He is stable, full code, and will be admitted to [facility] service. Thank you for allowing me to participate in the care of this nice gentleman.  Other plans as per orders.  Code Status: FULL Unk Lightning, MD. [facility] Pager [phone] 7pm to 7am.  08/20/2013, 4:13 AM

## 2013-08-20 NOTE — Consult Note (Signed)
Referring Provider: Dr. Burney Gauze ([facility]) Primary Care Physician:  No primary provider on file. Primary Gastroenterologist:  Dr.Millie Long ([facility]), Dr. Doy Mince  Reason for Consultation:  Abdominal pain in the setting of Waldmann's disease (intestinal lymphangiectasia)  HPI: [name] [name] is a 52 y.o. male admitted to the hospital last night because of excruciating and prolonged abdominal pain which began yesterday morning.  The patient has a long-standing history of abdominal complaints which culminated in recent years to very severe symptoms, lower extremity edema, weight loss, and so forth. Extensive evaluation, mostly at [facility], led to the conclusion that this was likely intestinal lymphangiectasia, and he has been treated for this with steroids, having completed a prednisone taper approximately 2-1/2 months ago. It is customary for him to have abdominal pain, which is usually fairly brief and evanescent, once or twice a day, most days. He might go a week or so without symptoms. At other times, the pain will last longer and may be associated with vomiting. It should be noted that prior CT scans have, on 2 separate occasions, showing intestinal intussusception, which led to exploratory laparotomy in [facility], which showed no small bowel tumor or other cause for the intussusceptions.  Since coming into the hospital, and having a CT scan which showed ileal edema and dilatation but no evidence of obstruction, the patient has had fairly substantial symptomatic improvement. He feels bloated, but is not having sharp pain. He last vomited last night after drinking the contrast for the CT scan. He actually feels ready to have clear liquids at this time.  The patient was started on Solu-Cortef last night and is receiving it every 8 hours.  Past Medical History  Diagnosis Date  . Hemorrhoids, external   . Abdominal pain     Past Surgical History  Procedure Laterality Date  .  Laproscopy      Prior to Admission medications   Medication Sig Start Date End Date Taking? Authorizing Provider  omeprazole (PRILOSEC) 20 MG capsule Take 20 mg by mouth daily.   Yes Historical Provider, MD    Current Facility-Administered Medications  Medication Dose Route Frequency Provider Last Rate Last Dose  . chlorproMAZINE (THORAZINE) tablet 50 mg  50 mg Oral TID PRN Houston Siren, MD      . dextrose 5 %-0.9 % sodium chloride infusion   Intravenous Continuous Houston Siren, MD 75 mL/hr at 08/20/13 0252    . hydrocortisone sodium succinate (SOLU-CORTEF) 100 mg/2 mL injection 50 mg  50 mg Intravenous Q8H Houston Siren, MD   50 mg at 08/20/13 0359  . HYDROmorphone (DILAUDID) injection 0.5-1 mg  0.5-1 mg Intravenous Q3H PRN Houston Siren, MD   0.5 mg at 08/20/13 0250  . ondansetron (ZOFRAN) tablet 4 mg  4 mg Oral Q6H PRN Houston Siren, MD       Or  . ondansetron [facility]) injection 4 mg  4 mg Intravenous Q6H PRN Houston Siren, MD      . pantoprazole (PROTONIX) EC tablet 40 mg  40 mg Oral Daily Houston Siren, MD   40 mg at 08/20/13 0850    Allergies as of 08/19/2013 - Review Complete 08/19/2013  Allergen Reaction Noted  . Sunflowerseed oil Anaphylaxis 11/14/2011  . Other  11/14/2011  . Sulfa antibiotics Rash 11/14/2011    History reviewed. No pertinent family history.  History   Social History  . Marital Status: Married    Spouse Name: N/A    Number of Children: N/A  . Years  of Education: N/A   Occupational History  . Not on file.   Social History Main Topics  . Smoking status: Never Smoker   . Smokeless tobacco: Never Used  . Alcohol Use: No  . Drug Use: No  . Sexual Activity: Not on file   Other Topics Concern  . Not on file   Social History Narrative  . No narrative on file    Review of Systems: The patient's bowel habits are somewhat irregular. Most days, he will have to bowel movements, the first one being fairly well formed, the second one being somewhat looser. However, there is quite a  bit of variability, and he will sometimes flip-flop between constipation and diarrhea. He indicates that his weight has been fairly stable in recent months. Overall, however, is quite symptomatic on almost a daily basis, and he basically is not very satisfied with the way things are going. He was last seen at [facility] about 6 months ago.  Physical Exam: Vital signs in last 24 hours: Temp:  [97.8 F (36.6 C)-98.2 F (36.8 C)] 97.8 F (36.6 C) (09/30 0518) Pulse Rate:  [60-72] 63 (09/30 0518) Resp:  [17-18] 17 (09/30 0518) BP: (144-159)/(73-95) 150/79 mmHg (09/30 0518) SpO2:  [97 %-100 %] 98 % (09/30 0518) Weight:  [63.504 kg (140 lb)-64.9 kg (143 lb 1.3 oz)] 64.9 kg (143 lb 1.3 oz) (09/30 0120) Last BM Date: 08/19/13 General:   Alert,  articulate, appears somewhat thin but not cachectic, fairly well preserved muscle mass. He is having intermittent hiccups, and he appears somewhat uncomfortable even though he denies pain. Head:  Normocephalic and atraumatic. Eyes:  Sclera clear, no icterus.   Lungs:  Clear throughout to auscultation.   No wheezes, crackles, or rhonchi. No evident respiratory distress. Heart:   Regular rate and rhythm; no murmurs, clicks, rubs,  or gallops. Abdomen:  Fairly muscular, nontender, nontympanitic, and nondistended. No masses, hepatosplenomegaly or ventral hernias noted. Soft nonobstructive bowel sounds, without bruits, guarding, or rebound.   Msk:   Symmetrical without gross deformities. Extremities:   Without clubbing, cyanosis, or edema. Specifically, no lower extremity edema at this time. Neurologic:  Alert and coherent;  grossly normal neurologically. Skin:  Intact without significant lesions or rashes. Psych:   Alert and cooperative. Normal mood and affect, but seems somewhat frustrated with his condition.  Intake/Output from previous day: 09/29 0701 - 09/30 0700 In: 686.3 [I.V.:686.3] Out: -  Intake/Output this shift:    Lab Results:  Recent  Labs  08/19/13 1638  WBC 15.3*  HGB 14.1  HCT 41.3  PLT 498*   BMET  Recent Labs  08/19/13 1638  NA 138  K 3.7  CL 103  CO2 24  GLUCOSE 129*  BUN 12  CREATININE 1.00  CALCIUM 8.7   LFT  Recent Labs  08/19/13 1638  PROT 4.9*  ALBUMIN 2.3*  AST 27  ALT 12  ALKPHOS 87  BILITOT 0.6   PT/INR No results found for this basename: LABPROT, INR,  in the last 72 hours   Studies/Results: Ct Abdomen Pelvis W Contrast  08/19/2013   CLINICAL DATA:  Abdominal pain.  History of Waldmanns disease.  EXAM: CT ABDOMEN AND PELVIS WITH CONTRAST  TECHNIQUE: Multidetector CT imaging of the abdomen and pelvis was performed using the standard protocol following bolus administration of intravenous contrast.  CONTRAST:  50mL OMNIPAQUE IOHEXOL 300 MG/ML  SOLN  COMPARISON:  CT scan 10/26/2012.  FINDINGS: The lung bases are clear. The heart is normal  in size. No pericardial effusion. Small hiatal hernia is noted.  Mild diffuse fatty infiltration of the liver with a few small scattered stable hepatic cysts. No worrisome hepatic lesions or intrahepatic biliary dilatation. The portal and hepatic veins are patent. The gallbladder is normal. No common bile duct dilatation. The pancreas is normal. The spleen is normal. The adrenal glands and kidneys are normal.  The stomach and duodenum are unremarkable. The ileal loops of small bowel demonstrate fairly significant dilatation with wall thickening and submucosal edema. No findings for obstruction. The jejunal loops appear relatively normal with normal mucosal fold pattern. There is also moderate abdominal and pelvic ascites and this may suggest hypoalbuminemia. The colon is unremarkable. Scattered mesenteric and retroperitoneal lymph nodes but no mass or adenopathy. A small periumbilical hernia is noted containing fat. The bladder, prostate gland and seminal vesicles are unremarkable. No inguinal mass or adenopathy.  The bony structures are unremarkable.   IMPRESSION: Ileal small bowel distention and wall thickening with submucosal edema. This could be secondary to ileal inflammatory process. Similar appearance on the prior examination. No definite obstruction.  Moderate abdominal/pelvic ascites.   Electronically Signed   By: Loralie Champagne M.D.   On: 08/19/2013 20:10    Impression: 1. Chronic intestinal illness characterized by recurring abdominal pain, nausea with small-volume vomiting, irregularity of bowel habit, and protein-losing enteropathy. Probable intestinal lymphangiectasia. Followed at [facility]. 2. Acute symptomatic exacerbation. This is the first time he has had to be hospitalized because of acute symptoms. The elevation of white count and platelet count suggests an inflammatory condition, as does the distal ileal thickening seen on CT scan. No obvious obstruction seen, suggesting that this is an exacerbation of his underlying condition rather than a mechanical complication of some sort. 3. Overall, moderately well compensated with this condition, but with evidence of borderline nutritional status (albumin 2.3, muscle mass reasonably well preserved) and ongoing symptoms which are quite bothersome for the patient.  Plan: 1. Agree with resumption of steroids 2. Depending on patient's clinical evolution, consider initiation of TNA or transfer to [facility]. 3. Trial of advancement of protein fortified clear liquid diet, with dietitian consult. 4. Advancement of diet as tolerated. 5. The patient is not satisfied with his overall condition and therefore I would consider him seeking out a center of excellence in small bowel diseases, perhaps the [facility] clinic. This could be done in concert with his attending gastroenterologist at [facility].   LOS: 1 day   Venise Ellingwood V  08/20/2013, 12:24 PM

## 2013-08-20 NOTE — Progress Notes (Signed)
[facility] PROGRESS NOTE  [name] [name] ZOX:[id number] DOB: Sep 18, 1961 DOA: 08/19/2013 PCP: No primary provider on file.  HPI/Subjective: 52 yo WM with a PMH of Waldman's Disease (protein losing enteropathy), presented to the ED with abdominal pain, nausea, vomiting, and hiccups x 24 hrs.  Of recent he has been able to control his abdominal pain with strict diet and had been off steroids since July 2014.    He is still uncomfortable in the right upper quadrant and epigastric region.  States he feels very bloated and his burping frequently.  No bowel movements today.   Assessment/Plan:  Abdominal Pain (Acute on Chronic): - Secondary to Waldman's Disease - Abdominal CT revealed distended ileus with wall thickening - Currently NPO - Awaiting GI consult for further determination of steroid regimen - Given Solu-Cortef 100mg /58mL q 8 hrs x 3 doses  Nausea: - Secondary to ileal distention - NPO - Supportive care - Will continue to monitor  Vomiting: - Secondary to ileal distention - NPO - Supportive care - IVF for dehydration - Stable  Hypoalbuminemia: - Secondary to decreased intake due to strict diet for Waldman's disease - Restricting IVF and oral intake - Will continue to monitor  Dehydration: - Secondary to decreased intake because of acute flare - IVF  - Will continue to monitor    Code Status: Full Family Communication: None at bedside  Disposition Plan: Remain inpatient   Consultants:  GI  Procedures:  None  Antibiotics:  None    Objective: Filed Vitals:   08/20/13 0518  BP: 150/79  Pulse: 63  Temp: 97.8 F (36.6 C)  Resp: 17    Intake/Output Summary (Last 24 hours) at 08/20/13 0834 Last data filed at 08/20/13 0651  Gross per 24 hour  Intake 686.25 ml  Output      0 ml  Net 686.25 ml   Filed Weights   08/19/13 1630 08/20/13 0120  Weight: 63.504 kg (140 lb) 64.9 kg (143 lb 1.3 oz)    Exam:   General:  Walking about the room  holding his stomach and slightly bent over seeming uncomfortable, but in no apparent distress  Cardiovascular: RRR, no murmurs, gallops, or rubs  Respiratory: Clear to auscultation bilaterally.  No rales, rhonchi, or wheezes  Abdomen: Generalized TTP throughout.  No masses or hernias felt.  Bowel sounds heard in all 4 quadrants  Musculoskeletal: Full ROM.  Strength 5/5 in all extremities.  No peripheral edema.  Distal pulses intact.  Psychiatric:  Alert and oriented x 3.  Good affect, responds appropriately.   Data Reviewed: Basic Metabolic Panel:  Recent Labs Lab 08/19/13 1638  NA 138  K 3.7  CL 103  CO2 24  GLUCOSE 129*  BUN 12  CREATININE 1.00  CALCIUM 8.7   Liver Function Tests:  Recent Labs Lab 08/19/13 1638  AST 27  ALT 12  ALKPHOS 87  BILITOT 0.6  PROT 4.9*  ALBUMIN 2.3*    Recent Labs Lab 08/19/13 1819  LIPASE 42   CBC:  Recent Labs Lab 08/19/13 1638  WBC 15.3*  NEUTROABS 13.3*  HGB 14.1  HCT 41.3  MCV 82.9  PLT 498*    Recent Results (from the past 240 hour(s))  URINE CULTURE     Status: None   Collection Time    08/19/13  5:02 PM      Result Value Range Status   Specimen Description URINE, CLEAN CATCH   Final   Special Requests NONE   Final   Culture  Setup Time     Final   Value: 08/19/2013 22:48     Performed at [facility] Count     Final   Value: NO GROWTH     Performed at [facility]   Culture     Final   Value: NO GROWTH     Performed at [facility]   Report Status 08/20/2013 FINAL   Final     Studies: Ct Abdomen Pelvis W Contrast  08/19/2013   CLINICAL DATA:  Abdominal pain.  History of Waldmanns disease.  EXAM: CT ABDOMEN AND PELVIS WITH CONTRAST  TECHNIQUE: Multidetector CT imaging of the abdomen and pelvis was performed using the standard protocol following bolus administration of intravenous contrast.  CONTRAST:  50mL OMNIPAQUE IOHEXOL 300 MG/ML  SOLN  COMPARISON:  CT scan  10/26/2012.  FINDINGS: The lung bases are clear. The heart is normal in size. No pericardial effusion. Small hiatal hernia is noted.  Mild diffuse fatty infiltration of the liver with a few small scattered stable hepatic cysts. No worrisome hepatic lesions or intrahepatic biliary dilatation. The portal and hepatic veins are patent. The gallbladder is normal. No common bile duct dilatation. The pancreas is normal. The spleen is normal. The adrenal glands and kidneys are normal.  The stomach and duodenum are unremarkable. The ileal loops of small bowel demonstrate fairly significant dilatation with wall thickening and submucosal edema. No findings for obstruction. The jejunal loops appear relatively normal with normal mucosal fold pattern. There is also moderate abdominal and pelvic ascites and this may suggest hypoalbuminemia. The colon is unremarkable. Scattered mesenteric and retroperitoneal lymph nodes but no mass or adenopathy. A small periumbilical hernia is noted containing fat. The bladder, prostate gland and seminal vesicles are unremarkable. No inguinal mass or adenopathy.  The bony structures are unremarkable.  IMPRESSION: Ileal small bowel distention and wall thickening with submucosal edema. This could be secondary to ileal inflammatory process. Similar appearance on the prior examination. No definite obstruction.  Moderate abdominal/pelvic ascites.   Electronically Signed   By: Loralie Champagne M.D.   On: 08/19/2013 20:10    Scheduled Meds: . sodium chloride   Intravenous STAT  . hydrocortisone sod succinate (SOLU-CORTEF) inj  50 mg Intravenous Q8H  . pantoprazole  40 mg Oral Daily   Continuous Infusions: . dextrose 5 % and 0.9% NaCl 75 mL/hr at 08/20/13 0252    Active Problems:   Abdominal pain   Protein-losing enteropathy   Hypoalbuminemia   Steroid long-term use   Hiccups   Dehydration   Joycelyn Man, PA-S  [facility] Pager [phone]. If 7PM-7AM, please contact  night-coverage at [email or web address], password [facility] 08/20/2013, 8:34 AM  LOS: 1 day         I have directly reviewed the clinical findings, lab, imaging studies and management of this patient in detail. I have interviewed and examined the patient and agree with the documentation,  as recorded by the Physician extender/ Student.  Leroy Sea M.D on 08/20/2013 at 12:11 PM  [facility] Office  [phone]

## 2013-08-20 NOTE — Care Management Note (Unsigned)
    Page 1 of 1   08/20/2013     3:52:22 PM   CARE MANAGEMENT NOTE 08/20/2013  Patient:  [name] [name]   Account Number:  [id number]  Date Initiated:  08/20/2013  Documentation initiated by:  Letha Cape  Subjective/Objective Assessment:   dx abd pain, n/v  admit- lives with spouse.     Action/Plan:   advance diet   Anticipated DC Date:  08/22/2013   Anticipated DC Plan:  HOME/SELF CARE      DC Planning Services  CM consult      Choice offered to / List presented to:             Status of service:  In process, will continue to follow Medicare Important Message given?   (If response is "NO", the following Medicare IM given date fields will be blank) Date Medicare IM given:   Date Additional Medicare IM given:    Discharge Disposition:    Per UR Regulation:  Reviewed for med. necessity/level of care/duration of stay  If discussed at Long Length of Stay Meetings, dates discussed:    Comments:  08/20/13 15:51 Letha Cape RN, BSN [phone] patient lives with spouse, pta indep.  Will advance diet to clears.  NCM will continue to follow for dc needs.

## 2013-08-20 NOTE — ED Notes (Signed)
Transporting to [facility] via [facility] at this time

## 2013-08-20 NOTE — Progress Notes (Signed)
INITIAL NUTRITION ASSESSMENT  DOCUMENTATION CODES Per approved criteria  -Not Applicable   INTERVENTION: Provide Resource Breeze po BID, each supplement provides 250 kcal and 9 grams of protein. Provide Beneprotein TID with meals Provide Pro-stat BID  Once diet advanced, change supplement to Ensure Complete   NUTRITION DIAGNOSIS: Inadequate oral intake related to restricted diet as evidenced by pt on clear liquid diet.   Goal: Pt to meet >/= 90% of their estimated nutrition needs   Monitor:  Diet advancement PO intake Weight Labs  Reason for Assessment: Consult/ MST  52 y.o. male  Admitting Dx: Acute on chronic abdominal pain  ASSESSMENT: 52 y.o. male with hx of protein losing enteropathy (Waldman's disease), with intermittent abdominal about once a month, lasting only about an hour, presents to [facility] with abdominal pain lasting all day along with hiccups, nausea and vomiting. He has been on a strict diet and was able to control his abdominal pain not requiring chronic narcotics.   Pt reports that he has only been drinking a small amount of clear liquids for the past 2 days but, reports that he was eating well previously. He usually eats 3 meals, one snack and one OrGain protein shake daily. He eats chicken, eggs, or lean beef at each meal and usually has greek yogurt as his snack. He follows a low-fat diet and uses coconut oil for cooking to get medium-chain triglycerides; tolerates coconut oil well. Pt states he has been maintaining his weight around 143 lbs. He reports eating bananas when he has diarrhea and taking Miralax once daily for constipation.   Nutrition Focused Physical Exam:  Subcutaneous Fat:  Orbital Region: wnl Upper Arm Region: wnl Thoracic and Lumbar Region: moderate wasting  Muscle:  Temple Region: mild wasting Clavicle Bone Region: moderate wasting Clavicle and Acromion Bone Region: moderate wasting Scapular Bone Region: mild/moderate wasting Dorsal  Hand: mild wasting Patellar Region: wnl Anterior Thigh Region: wnl Posterior Calf Region: wnl  Edema: none  Height: Ht Readings from Last 1 Encounters:  08/20/13 5' 1.2" (1.554 m)  Inaccurate- pt reports height is 5'10"  Weight: Wt Readings from Last 1 Encounters:  08/20/13 143 lb 1.3 oz (64.9 kg)    Ideal Body Weight: 166 lbs  % Ideal Body Weight: 86%  Wt Readings from Last 10 Encounters:  08/20/13 143 lb 1.3 oz (64.9 kg)  08/20/13 143 lb 1.3 oz (64.9 kg)    Usual Body Weight: 143 lbs  % Usual Body Weight: 100%  BMI:  Body mass index is 26.87 kg/(m^2).  Estimated Nutritional Needs: Kcal: 1900-2100 Protein: 90-100 grams Fluid: 2.1 L/day  Skin: intact  Diet Order: Clear Liquid Clear Liquid  EDUCATION NEEDS: -No education needs identified at this time   Intake/Output Summary (Last 24 hours) at 08/20/13 1633 Last data filed at 08/20/13 0651  Gross per 24 hour  Intake 686.25 ml  Output      0 ml  Net 686.25 ml    Last BM: 9/29  Labs:   Recent Labs Lab 08/19/13 1638  NA 138  K 3.7  CL 103  CO2 24  BUN 12  CREATININE 1.00  CALCIUM 8.7  GLUCOSE 129*    CBG (last 3)  No results found for this basename: GLUCAP,  in the last 72 hours  Scheduled Meds: . feeding supplement  1 Container Oral TID BM  . heparin subcutaneous  5,000 Units Subcutaneous Q8H  . hydrocortisone sod succinate (SOLU-CORTEF) inj  50 mg Intravenous Q8H  . pantoprazole  40 mg Oral Daily    Continuous Infusions: . dextrose 5 % and 0.9% NaCl 75 mL/hr at 08/20/13 1622    Past Medical History  Diagnosis Date  . Hemorrhoids, external   . Abdominal pain     Past Surgical History  Procedure Laterality Date  . Laproscopy      Ian Malkin RD, LDN Inpatient Clinical Dietitian Pager: [phone] After Hours Pager: [phone]

## 2013-08-20 NOTE — Progress Notes (Signed)
Utilization review completed. Makylie Rivere RN CCM Case Mgmt  

## 2013-08-21 DIAGNOSIS — E8809 Other disorders of plasma-protein metabolism, not elsewhere classified: Secondary | ICD-10-CM

## 2013-08-21 LAB — COMPREHENSIVE METABOLIC PANEL
ALT: 10 U/L (ref 0–53)
AST: 24 U/L (ref 0–37)
Alkaline Phosphatase: 72 U/L (ref 39–117)
CO2: 25 mEq/L (ref 19–32)
Calcium: 7.9 mg/dL — ABNORMAL LOW (ref 8.4–10.5)
Chloride: 103 mEq/L (ref 96–112)
Creatinine, Ser: 1.13 mg/dL (ref 0.50–1.35)
GFR calc non Af Amer: 74 mL/min — ABNORMAL LOW (ref >90)
Potassium: 4.3 mEq/L (ref 3.5–5.1)
Sodium: 136 mEq/L (ref 135–145)
Total Bilirubin: 0.6 mg/dL (ref 0.3–1.2)

## 2013-08-21 LAB — CBC WITH DIFFERENTIAL/PLATELET
Basophils Absolute: 0 10*3/uL (ref 0.0–0.1)
Basophils Relative: 0 % (ref 0–1)
Eosinophils Absolute: 0 10*3/uL (ref 0.0–0.7)
Eosinophils Relative: 0 % (ref 0–5)
Hemoglobin: 13.6 g/dL (ref 13.0–17.0)
Lymphs Abs: 1.3 10*3/uL (ref 0.7–4.0)
MCH: 28.5 pg (ref 26.0–34.0)
MCHC: 34.5 g/dL (ref 30.0–36.0)
MCV: 82.6 fL (ref 78.0–100.0)
Monocytes Relative: 7 % (ref 3–12)
Neutro Abs: 10.3 10*3/uL — ABNORMAL HIGH (ref 1.7–7.7)
Platelets: 457 10*3/uL — ABNORMAL HIGH (ref 150–400)
RBC: 4.77 MIL/uL (ref 4.22–5.81)

## 2013-08-21 LAB — PREALBUMIN: Prealbumin: 17.2 mg/dL — ABNORMAL LOW (ref 17.0–34.0)

## 2013-08-21 MED ORDER — PREDNISONE 10 MG PO TABS: 30.0000 mg | ORAL_TABLET | Freq: Every day | ORAL | Status: AC

## 2013-08-21 MED ORDER — PREDNISONE 10 MG PO TABS
30.0000 mg | ORAL_TABLET | Freq: Every day | ORAL | Status: DC
  Administered 2013-08-21: 30 mg via ORAL
  Filled 2013-08-21 (×2): qty 1

## 2013-08-21 NOTE — Progress Notes (Signed)
Lennette Bihari to be D/C'd Home per MD order.  Discussed with the patient and all questions fully answered.    Medication List         omeprazole 20 MG capsule  Commonly known as:  PRILOSEC  Take 20 mg by mouth daily.     predniSONE 10 MG tablet  Commonly known as:  DELTASONE  Take 3 tablets (30 mg total) by mouth daily with breakfast.        VVS, Skin clean, dry and intact without evidence of skin break down, no evidence of skin tears noted. IV catheter discontinued intact. Site without signs and symptoms of complications. Dressing and pressure applied.  An After Visit Summary was printed and given to the patient. Follow up appointments , new prescriptions and medication administration times given.  Patient escorted via WC, and D/C home via private auto.  Cindra Eves, RN 08/21/2013 1:58 PM

## 2013-08-21 NOTE — Progress Notes (Signed)
Feeling well overnight.  No further pain or need for pain meds.  Reasonable tolerance of clr liq diet; feels ready to advance (have ordered).  Had 2 BM's this a.m. (first formed, second loose--which is typical for him); since then, his bloating and reflux/regurg sx are much better.  Recomm:  1.  Advance diet (ordered); if this is tolerated, would be ok for dischg from my standpoint (pt feels ready to go home).  2.  Would dischg on prednisone 30 mg po qAM (first dose today--have ordered); have d/c'd solu-cortef.  3.  Have instructed pt to contact his gastroenterologist at [facility], Dr. Loreli Dollar, for further instructions and follow-up; he should call us if he has trouble contacting her.  Florencia Reasons, M.D. [phone] .

## 2013-08-21 NOTE — Discharge Summary (Signed)
Physician Discharge Summary  [name] [name] ZOX:[id number] DOB: 1961/09/03 DOA: 08/19/2013  PCP: No primary provider on file.  Admit date: 08/19/2013 Discharge date: 08/21/2013  Time spent: 40 minutes  Recommendations for Outpatient Follow-up:  1. Followup with primary gastroenterologist in one week.  Discharge Diagnoses:  Active Problems:   Abdominal pain   Protein-losing enteropathy   Hypoalbuminemia   Steroid long-term use   Hiccups   Dehydration   Discharge Condition: Stable  Diet recommendation: Low-fat diet  Filed Weights   08/19/13 1630 08/20/13 0120  Weight: 63.504 kg (140 lb) 64.9 kg (143 lb 1.3 oz)    History of present illness:  [name] [name] is an 52 y.o. male with hx of protein losing enteropathy (Waldman's disease), with intermittent abdominal about once a month, lasting only about an hour, presents to [facility] with abdominal pain lasting all day along with hiccups, nausea and vomiting. He has been on a strict diet and was able to control his abdominal pain not requiring chronic narcotics. He has been on steroids for quite sometimes, but has been off for several months. He has no black or bloody stools. Evalaution in the ER included an abdominal pelvic CT which showed ileal inflammation, but no definite obtruction. He has normal electrolytes, liver fx tests, normal renal fx tests, but has leukocytosis with WBC of 15K. Hospitalist was asked to admit him for symptomatic treatment of his nausea, vomiting, and abdominal pain.  Hospital Course:   1. Acute on chronic abdominal pain: Secondary to Waldman's disease, abdominal CT scan revealed distended ileus wall thickening, patient was n.p.o. this appears to be radiological findings as patient continues to have bowel movements without distended abdomen. At the time of admission GI evaluated the patient and recommended advance diet as tolerated. Patient was started on Solu-Cortef on admission was was switched to prednisone 30 mg upon  discharge. Patient to followup with primary gastroenterologist at [facility]. Will be discharged on 30 mg of prednisone until he sees GI as outpatient.  2. Nausea: Secondary to #1, patient as mentioned above was n.p.o., supportive care with antiemetics. This is resolved and his diet advanced and tolerated that as well.  3. Hypoalbuminemia: Secondary to [facility] disease, patient probably has malabsorption secondary to that. Was on IV fluids, protein shakes recommended.  4. Dehydration: Patient felt dehydrated, secondary to nausea/vomiting and poor oral intake. Start IV fluids and admission.  Procedures:  None  Consultations:  [facility], Dr. Nadine Counts Buccini  Discharge Exam: Filed Vitals:   08/21/13 0437  BP: 145/80  Pulse: 71  Temp: 97.8 F (36.6 C)  Resp: 20   General: Alert and awake, oriented x3, not in any acute distress. HEENT: anicteric sclera, pupils reactive to light and accommodation, EOMI CVS: S1-S2 clear, no murmur rubs or gallops Chest: clear to auscultation bilaterally, no wheezing, rales or rhonchi Abdomen: soft nontender, nondistended, normal bowel sounds, no organomegaly Extremities: no cyanosis, clubbing or edema noted bilaterally Neuro: Cranial nerves II-XII intact, no focal neurological deficits  Discharge Instructions  Discharge Orders   Future Orders Complete By Expires   Increase activity slowly  As directed        Medication List         omeprazole 20 MG capsule  Commonly known as:  PRILOSEC  Take 20 mg by mouth daily.     predniSONE 10 MG tablet  Commonly known as:  DELTASONE  Take 3 tablets (30 mg total) by mouth daily with breakfast.       Allergies  Allergen Reactions  . Sunflowerseed Oil Anaphylaxis  . Other     Cantaloupe, Apple, Plum Reaction: tingling in mouth  . Peanut-Containing Drug Products Other (See Comments)    Hurts stomach   . Sulfa Antibiotics Rash       Follow-up Information   Follow up with Long, Millie D, MD In 1  week.   Specialty:  Internal Medicine   Contact information:   [address] [facility] [id number] [employer] [address] [place] [address] [phone]        The results of significant diagnostics from this hospitalization (including imaging, microbiology, ancillary and laboratory) are listed below for reference.    Significant Diagnostic Studies: Ct Abdomen Pelvis W Contrast  08/19/2013   CLINICAL DATA:  Abdominal pain.  History of Waldmanns disease.  EXAM: CT ABDOMEN AND PELVIS WITH CONTRAST  TECHNIQUE: Multidetector CT imaging of the abdomen and pelvis was performed using the standard protocol following bolus administration of intravenous contrast.  CONTRAST:  50mL OMNIPAQUE IOHEXOL 300 MG/ML  SOLN  COMPARISON:  CT scan 10/26/2012.  FINDINGS: The lung bases are clear. The heart is normal in size. No pericardial effusion. Small hiatal hernia is noted.  Mild diffuse fatty infiltration of the liver with a few small scattered stable hepatic cysts. No worrisome hepatic lesions or intrahepatic biliary dilatation. The portal and hepatic veins are patent. The gallbladder is normal. No common bile duct dilatation. The pancreas is normal. The spleen is normal. The adrenal glands and kidneys are normal.  The stomach and duodenum are unremarkable. The ileal loops of small bowel demonstrate fairly significant dilatation with wall thickening and submucosal edema. No findings for obstruction. The jejunal loops appear relatively normal with normal mucosal fold pattern. There is also moderate abdominal and pelvic ascites and this may suggest hypoalbuminemia. The colon is unremarkable. Scattered mesenteric and retroperitoneal lymph nodes but no mass or adenopathy. A small periumbilical hernia is noted containing fat. The bladder, prostate gland and seminal vesicles are unremarkable. No inguinal mass or adenopathy.  The bony structures are unremarkable.  IMPRESSION: Ileal small bowel distention and wall  thickening with submucosal edema. This could be secondary to ileal inflammatory process. Similar appearance on the prior examination. No definite obstruction.  Moderate abdominal/pelvic ascites.   Electronically Signed   By: Loralie Champagne M.D.   On: 08/19/2013 20:10    Microbiology: Recent Results (from the past 240 hour(s))  URINE CULTURE     Status: None   Collection Time    08/19/13  5:02 PM      Result Value Range Status   Specimen Description URINE, CLEAN CATCH   Final   Special Requests NONE   Final   Culture  Setup Time     Final   Value: 08/19/2013 22:48     Performed at [facility] Count     Final   Value: NO GROWTH     Performed at [facility]   Culture     Final   Value: NO GROWTH     Performed at [facility]   Report Status 08/20/2013 FINAL   Final     Labs: Basic Metabolic Panel:  Recent Labs Lab 08/19/13 1638 08/21/13 0410  NA 138 136  K 3.7 4.3  CL 103 103  CO2 24 25  GLUCOSE 129* 117*  BUN 12 18  CREATININE 1.00 1.13  CALCIUM 8.7 7.9*  MG  --  1.9   Liver Function Tests:  Recent Labs Lab 08/19/13 1638 08/21/13 0410  AST 27 24  ALT 12 10  ALKPHOS 87 72  BILITOT 0.6 0.6  PROT 4.9* 4.6*  ALBUMIN 2.3* 2.0*    Recent Labs Lab 08/19/13 1819  LIPASE 42   No results found for this basename: AMMONIA,  in the last 168 hours CBC:  Recent Labs Lab 08/19/13 1638 08/21/13 0410  WBC 15.3* 12.5*  NEUTROABS 13.3* 10.3*  HGB 14.1 13.6  HCT 41.3 39.4  MCV 82.9 82.6  PLT 498* 457*   Cardiac Enzymes: No results found for this basename: CKTOTAL, CKMB, CKMBINDEX, TROPONINI,  in the last 168 hours BNP: BNP (last 3 results) No results found for this basename: PROBNP,  in the last 8760 hours CBG: No results found for this basename: GLUCAP,  in the last 168 hours     Signed:  Chidinma Clites A  [facility] 08/21/2013, 12:11 PM

## 2013-10-07 ENCOUNTER — Other Ambulatory Visit: Payer: Self-pay | Admitting: Internal Medicine

## 2013-10-07 DIAGNOSIS — IMO0002 Reserved for concepts with insufficient information to code with codable children: Secondary | ICD-10-CM

## 2013-11-08 ENCOUNTER — Ambulatory Visit
Admission: RE | Admit: 2013-11-08 | Discharge: 2013-11-08 | Disposition: A | Discharge status: Home / Self Care | Payer: BC Managed Care – PPO | Source: Ambulatory Visit | Attending: Internal Medicine | Admitting: Internal Medicine

## 2013-11-08 DIAGNOSIS — IMO0002 Reserved for concepts with insufficient information to code with codable children: Secondary | ICD-10-CM

## 2013-11-28 ENCOUNTER — Ambulatory Visit (INDEPENDENT_AMBULATORY_CARE_PROVIDER_SITE_OTHER): Payer: BC Managed Care – PPO | Admitting: Family Medicine

## 2013-11-28 DIAGNOSIS — K9089 Other intestinal malabsorption: Secondary | ICD-10-CM

## 2013-11-28 NOTE — Progress Notes (Signed)
Nutrition Counseling  Heard about our clinic from [employer]  Here today to discuss Wt gain and increasing albumin level  Waldmann's syndrome.   Would like naturel methods rather than pills.   Goes from constipated to diarrhea  GI doctor now at [facility].  Previously w/ Dr. Michail Sermon adn [place] here in town  Feels like no improvement over past several years.   Nutritional interventions: cycles of prednisone for anti-inflammatory effects Some improvement w/ prednisone.  Not interested in TPN at home Increased home protein intake.  Low fat and high protein diet since February.  abd pain flares from time to time adn unable to identify triggers.  Has tried modified elimination diet.  Allergy testing performed.  Months of dietary journaling w/o reliable trigger identified. Off prednisone pt can go from 3 days to 10 w/o and then w/ pain. On prednisone, frequency of attacks diminished to ~7-14 days. Has tried gluten free diet w/o benefit.  No h/o lactose intolerance and neg on testing.  "Stomach problems for years," per pt. Significant worsening of symptoms over the past several years.  recent   Usual eating pattern includes protein for 3 meals (eggs vs egg whites, vs tuna vs low fat ham..Marland KitchenMarland KitchenMarland Kitchen) w/ some carb and low fat and protein bar, greek yogurt, carb snack (pretzels) 2 snacks per day.  Takes Vit D3 2,000 units daily and MV daily  24-hr recall: (Up at  AM) B ( AM)-   No breakfast due to abd pain (typically egg whites or eggs w/ bread) Snk ( AM)-   none L ( PM)-  12:00 (subway) [place] sandwhich with cheese lettuce, tomato on bread, soday Snk ( PM)-  [place] yogurt D ( PM)-  Tunafish sandwich  Snk ( PM)-  Pretzels apple pie w/o crust. (time in bed) -    Follow up:   Please contact your specialists in [facility] about other immune suppression medications outside of steroid based medications  Low glycemic diet as this improves inflammatory state. Decrease  processed sugars and increase natural sugars through berries that are higher in fiber and can be antiinflammatory  Take Omega 3 pills, 2 grams per day  Increase fermented foods for improved microflora - sauerkraut, yogurt, etc.  Consider going to [facility] for products on improving natural flor in gut:  [facility] [address] [place], [place] [address]   Reduce use of prilosec except for use for reflux flare  Ensuring you have adequate Vit D may help to decrease flares w/ levels to 40-50ng range.   Try a hydrolyzed protein supplement such as Pro-stat  Make following changes consistently for ~3 months  Pt waiting to try Cumen  Please return in 3 months or sooner to follow up.   Linna Darner, MD Family Medicine PGY-3 11/28/2013, 12:43 PM  Spent > 58min in direct pt care and counseling.

## 2013-11-28 NOTE — Patient Instructions (Signed)
Thank you for coming in today Your nutritional needs are very complex. Please consider the following recommendations:   Please contact your specialists in [facility] about other immune suppression medications outside of steroid based medications  Low glycemic diet as this improves inflammatory state. Decrease processed sugars and increase natural sugars through berries that are higher in fiber and can be antiinflammatory. ([email or web address])  Take Omega 3 pills, 2 grams per day  Increase fermented foods for improved microflora - sauerkraut, yogurt, etc.  Consider going to [facility] [employer] [employer]) for products on improving natural flor in gut and for antiinflammatory foods/supplements:  [facility] [place], [place] [address]   Reduce use of prilosec except for use for reflux flare  Ensuring you have adequate Vit D may help to decrease flares w/ levels to 40-50ng range.   Try a hydrolyzed protein supplement such as Pro-stat  Make following changes consistently for ~3 months  Please return in 3 months or sooner to follow up.

## 2014-09-24 ENCOUNTER — Ambulatory Visit: Payer: BC Managed Care – PPO | Admitting: Family Medicine

## 2017-01-15 DIAGNOSIS — R197 Diarrhea, unspecified: Secondary | ICD-10-CM | POA: Diagnosis not present

## 2017-01-15 DIAGNOSIS — E860 Dehydration: Secondary | ICD-10-CM | POA: Diagnosis not present

## 2017-01-15 DIAGNOSIS — R000 Tachycardia, unspecified: Secondary | ICD-10-CM | POA: Diagnosis not present

## 2017-01-19 DIAGNOSIS — K529 Noninfective gastroenteritis and colitis, unspecified: Secondary | ICD-10-CM | POA: Diagnosis not present

## 2017-03-28 DIAGNOSIS — K5090 Crohn's disease, unspecified, without complications: Secondary | ICD-10-CM | POA: Diagnosis not present

## 2017-04-26 DIAGNOSIS — K909 Intestinal malabsorption, unspecified: Secondary | ICD-10-CM | POA: Diagnosis not present

## 2017-04-26 DIAGNOSIS — K5281 Eosinophilic gastritis or gastroenteritis: Secondary | ICD-10-CM | POA: Diagnosis not present

## 2017-06-29 DIAGNOSIS — L821 Other seborrheic keratosis: Secondary | ICD-10-CM | POA: Diagnosis not present

## 2017-06-29 DIAGNOSIS — L570 Actinic keratosis: Secondary | ICD-10-CM | POA: Diagnosis not present

## 2017-06-29 DIAGNOSIS — K432 Incisional hernia without obstruction or gangrene: Secondary | ICD-10-CM | POA: Diagnosis not present

## 2017-08-18 DIAGNOSIS — K5282 Eosinophilic colitis: Secondary | ICD-10-CM | POA: Diagnosis not present

## 2017-08-18 DIAGNOSIS — K5281 Eosinophilic gastritis or gastroenteritis: Secondary | ICD-10-CM | POA: Diagnosis not present

## 2017-08-18 DIAGNOSIS — Z91128 Patient's intentional underdosing of medication regimen for other reason: Secondary | ICD-10-CM | POA: Diagnosis not present

## 2017-09-28 DIAGNOSIS — K432 Incisional hernia without obstruction or gangrene: Secondary | ICD-10-CM | POA: Diagnosis not present

## 2018-02-02 DIAGNOSIS — K5281 Eosinophilic gastritis or gastroenteritis: Secondary | ICD-10-CM | POA: Diagnosis not present

## 2018-02-02 DIAGNOSIS — K9049 Malabsorption due to intolerance, not elsewhere classified: Secondary | ICD-10-CM | POA: Diagnosis not present

## 2018-02-02 DIAGNOSIS — K5282 Eosinophilic colitis: Secondary | ICD-10-CM | POA: Diagnosis not present

## 2018-02-27 DIAGNOSIS — K5281 Eosinophilic gastritis or gastroenteritis: Secondary | ICD-10-CM | POA: Diagnosis not present

## 2018-02-27 DIAGNOSIS — R600 Localized edema: Secondary | ICD-10-CM | POA: Diagnosis not present

## 2018-02-27 DIAGNOSIS — Z6820 Body mass index (BMI) 20.0-20.9, adult: Secondary | ICD-10-CM | POA: Diagnosis not present

## 2018-02-27 DIAGNOSIS — K9049 Malabsorption due to intolerance, not elsewhere classified: Secondary | ICD-10-CM | POA: Diagnosis not present

## 2018-03-09 DIAGNOSIS — M79673 Pain in unspecified foot: Secondary | ICD-10-CM | POA: Diagnosis not present

## 2018-03-12 DIAGNOSIS — M79674 Pain in right toe(s): Secondary | ICD-10-CM | POA: Diagnosis not present

## 2018-03-13 ENCOUNTER — Other Ambulatory Visit: Payer: Self-pay

## 2018-03-13 DIAGNOSIS — G8929 Other chronic pain: Secondary | ICD-10-CM

## 2018-03-13 DIAGNOSIS — M79674 Pain in right toe(s): Principal | ICD-10-CM

## 2018-03-22 ENCOUNTER — Other Ambulatory Visit: Payer: Self-pay

## 2018-03-22 ENCOUNTER — Ambulatory Visit (INDEPENDENT_AMBULATORY_CARE_PROVIDER_SITE_OTHER): Payer: 59 | Admitting: Vascular Surgery

## 2018-03-22 ENCOUNTER — Ambulatory Visit (HOSPITAL_COMMUNITY)
Admission: RE | Admit: 2018-03-22 | Discharge: 2018-03-22 | Disposition: A | Discharge status: Home / Self Care | Payer: 59 | Source: Ambulatory Visit | Attending: Vascular Surgery | Admitting: Vascular Surgery

## 2018-03-22 ENCOUNTER — Encounter: Payer: Self-pay | Admitting: Vascular Surgery

## 2018-03-22 VITALS — BP 130/78 | HR 72 | Temp 96.8°F | Resp 16 | Ht 70.0 in | Wt 140.0 lb

## 2018-03-22 DIAGNOSIS — G8929 Other chronic pain: Secondary | ICD-10-CM | POA: Insufficient documentation

## 2018-03-22 DIAGNOSIS — R230 Cyanosis: Secondary | ICD-10-CM | POA: Diagnosis not present

## 2018-03-22 DIAGNOSIS — M79674 Pain in right toe(s): Secondary | ICD-10-CM

## 2018-03-22 NOTE — Progress Notes (Signed)
Patient name: [name] [name] MRN: [id number] DOB: July 19, 1961 Sex: male   REASON FOR CONSULT:    Ischemic  Toes left foot.  The consult is requested by Dr. Orpah Melter.  HPI:   [name] [name] is a pleasant 57 y.o. male, who was referred with ischemic toes on the left foot.  The patient noted some pain on the toes of his left foot approximately 2 weeks ago.  He subsequently noticed some bluish discoloration of the left third fourth and fifth toes.  He states that the color for the most part has been persistent but does intermittently look better.  He has no history of atrial fibrillation, or other obvious source for embolization.  He denies any history of claudication, rest pain, or nonhealing ulcers.  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  He is not aware of any trauma to the left foot or any poorly fitting shoes that may have caused injury to the toes.  He does not have any history of significant cold exposure or frostbite.  I do not really get a history of episodic discoloration to suggest Raynaud's syndrome.  I reviewed the records that were sent from the referring office.  The patient had been complaining of burning throbbing pain in the left foot with some blue toes especially the fifth toe.  This was from a visit on 03/12/2018.  He had previously reported that his fingers would sometimes turn blue with cold exposure.  Past Medical History:  Diagnosis Date  . Abdominal pain   . Hemorrhoids, external     History reviewed. No pertinent family history.  There is no family history of premature cardiovascular disease.  SOCIAL HISTORY: He is not a smoker. Social History   Socioeconomic History  . Marital status: Married    Spouse name: Not on file  . Number of children: Not on file  . Years of education: Not on file  . Highest education level: Not on file  Occupational History  . Not on file  Social Needs  . Financial resource strain: Not on file  . Food  insecurity:    Worry: Not on file    Inability: Not on file  . Transportation needs:    Medical: Not on file    Non-medical: Not on file  Tobacco Use  . Smoking status: Never Smoker  . Smokeless tobacco: Never Used  Substance and Sexual Activity  . Alcohol use: No  . Drug use: No    Frequency: 3.0 times per week  . Sexual activity: Not on file  Lifestyle  . Physical activity:    Days per week: Not on file    Minutes per session: Not on file  . Stress: Not on file  Relationships  . Social connections:    Talks on phone: Not on file    Gets together: Not on file    Attends religious service: Not on file    Active member of club or organization: Not on file    Attends meetings of clubs or organizations: Not on file    Relationship status: Not on file  . Intimate partner violence:    Fear of current or ex partner: Not on file    Emotionally abused: Not on file    Physically abused: Not on file    Forced sexual activity: Not on file  Other Topics Concern  . Not on file  Social History Narrative  . Not on file    Allergies  Allergen  Reactions  . Sunflower Oil Anaphylaxis  . Sunflowerseed Oil Anaphylaxis  . Other     Cantaloupe, Apple, Plum Reaction: tingling in mouth  . Peanut Oil     Other reaction(s): Other (See Comments) Hurts stomach   . Peanut-Containing Drug Products Other (See Comments)    Hurts stomach   . Shellfish Allergy     Other reaction(s): OTHER  . Sulfamethoxazole Hives  . Sulfa Antibiotics Rash    Current Outpatient Medications  Medication Sig Dispense Refill  . omeprazole (PRILOSEC) 20 MG capsule Take 20 mg by mouth daily.    . predniSONE (DELTASONE) 10 MG tablet Take 3 tablets (30 mg total) by mouth daily with breakfast. 60 tablet 0  . Probiotic Product (PROBIOTIC DAILY PO) Take by mouth.     No current facility-administered medications for this visit.     REVIEW OF SYSTEMS:  [X]  denotes positive finding, [ ]  denotes negative  finding Cardiac  Comments:  Chest pain or chest pressure:    Shortness of breath upon exertion:    Short of breath when lying flat:    Irregular heart rhythm:        Vascular    Pain in calf, thigh, or hip brought on by ambulation:    Pain in feet at night that wakes you up from your sleep:  x   Blood clot in your veins:    Leg swelling:         Pulmonary    Oxygen at home:    Productive cough:     Wheezing:         Neurologic    Sudden weakness in arms or legs:     Sudden numbness in arms or legs:     Sudden onset of difficulty speaking or slurred speech:    Temporary loss of vision in one eye:     Problems with dizziness:         Gastrointestinal    Blood in stool:     Vomited blood:         Genitourinary    Burning when urinating:     Blood in urine:        Psychiatric    Major depression:         Hematologic    Bleeding problems:    Problems with blood clotting too easily:        Skin    Rashes or ulcers:        Constitutional    Fever or chills:     PHYSICAL EXAM:   Vitals:   03/22/18 0832  BP: 130/78  Pulse: 72  Resp: 16  Temp: (!) 96.8 F (36 C)  TempSrc: Oral  SpO2: 100%  Weight: 140 lb (63.5 kg)  Height: 5\' 10"  (1.778 m)    GENERAL: The patient is a well-nourished male, in no acute distress. The vital signs are documented above. CARDIAC: There is a regular rate and rhythm.  VASCULAR: I do not detect carotid bruits. He has palpable femoral, popliteal, dorsalis pedis, and posterior tibial pulses bilaterally. There is no significant lower extremity swelling. PULMONARY: There is good air exchange bilaterally without wheezing or rales. ABDOMEN: Soft and non-tender with normal pitched bowel sounds.  MUSCULOSKELETAL: There are no major deformities or cyanosis. NEUROLOGIC: No focal weakness or paresthesias are detected. SKIN: He has bluish discoloration of the third fourth and fifth toes of the left foot. PSYCHIATRIC: The patient has a normal  affect.  DATA:  ARTERIAL DOPPLER STUDY: I have independently interpreted his arterial Doppler study.    On the right side there is a triphasic dorsalis pedis and posterior tibial signal.  ABI is 100%.  Toe pressure is 109 mmHg.  On the left side, there is a triphasic dorsalis pedis and posterior tibial signal.  ABI is 100%.  Toe pressure is 147 mmHg.  CT ABDOMEN PELVIS: I reviewed a CT of the abdomen and pelvis that was done in September 2014.  The patient had no significant aortoiliac occlusive disease or significant calcific disease to suggest a possible source of embolization.  The study was however 5 years ago.  MEDICAL ISSUES:   POSSIBLE ATHEROEMBOLIC DISEASE TO LEFT FOOT: This patient developed apparently the fairly abrupt onset of pain and bluish discoloration on the left third fourth and fifth toes.  He is unaware of any trauma to this area or poorly fitting shoes.  Thus I think atheroembolic disease needs to be on the differential diagnosis.  This reason I have recommended a CT Angie with the chest and CT angios of the abdomen and pelvis with runoff to look for potential source of embolization.  I had a long discussion with the patient concerning the indications for this.  If he had significant ulceration within the aorta that was potentially a source for embolus this could potentially be addressed with angioplasty or stenting.  However, I have explained I really need to see the films before deciding what her options were.  If the CT scan does not show any evidence of atheroembolic disease in this may simply be small vessel disease or potentially bruising related to being on steroids.  I reassured him however that he had normal pulses, normal Doppler signals in the foot, and a normal toe pressure bilaterally.  I will see him back after his CT angiogram.  Deitra Mayo [facility] [phone]

## 2018-03-30 DIAGNOSIS — K5281 Eosinophilic gastritis or gastroenteritis: Secondary | ICD-10-CM | POA: Diagnosis not present

## 2018-04-04 ENCOUNTER — Other Ambulatory Visit: Payer: 59

## 2018-04-13 DIAGNOSIS — K5281 Eosinophilic gastritis or gastroenteritis: Secondary | ICD-10-CM | POA: Diagnosis not present

## 2018-04-25 ENCOUNTER — Ambulatory Visit: Payer: 59 | Admitting: Vascular Surgery

## 2018-05-11 DIAGNOSIS — D509 Iron deficiency anemia, unspecified: Secondary | ICD-10-CM | POA: Diagnosis not present

## 2018-05-11 DIAGNOSIS — K5281 Eosinophilic gastritis or gastroenteritis: Secondary | ICD-10-CM | POA: Diagnosis not present

## 2018-06-05 DIAGNOSIS — R112 Nausea with vomiting, unspecified: Secondary | ICD-10-CM | POA: Diagnosis not present

## 2018-06-05 DIAGNOSIS — R197 Diarrhea, unspecified: Secondary | ICD-10-CM | POA: Diagnosis not present

## 2018-06-05 DIAGNOSIS — K5281 Eosinophilic gastritis or gastroenteritis: Secondary | ICD-10-CM | POA: Diagnosis not present

## 2018-06-05 DIAGNOSIS — K9049 Malabsorption due to intolerance, not elsewhere classified: Secondary | ICD-10-CM | POA: Diagnosis not present

## 2018-07-03 DIAGNOSIS — L821 Other seborrheic keratosis: Secondary | ICD-10-CM | POA: Diagnosis not present

## 2018-07-03 DIAGNOSIS — D229 Melanocytic nevi, unspecified: Secondary | ICD-10-CM | POA: Diagnosis not present

## 2018-07-03 DIAGNOSIS — D225 Melanocytic nevi of trunk: Secondary | ICD-10-CM | POA: Diagnosis not present

## 2018-07-03 DIAGNOSIS — L570 Actinic keratosis: Secondary | ICD-10-CM | POA: Diagnosis not present

## 2018-07-06 DIAGNOSIS — K5281 Eosinophilic gastritis or gastroenteritis: Secondary | ICD-10-CM | POA: Diagnosis not present

## 2018-08-31 DIAGNOSIS — K5281 Eosinophilic gastritis or gastroenteritis: Secondary | ICD-10-CM | POA: Diagnosis not present

## 2018-09-11 DIAGNOSIS — D721 Eosinophilia: Secondary | ICD-10-CM | POA: Diagnosis not present

## 2018-09-11 DIAGNOSIS — Z6821 Body mass index (BMI) 21.0-21.9, adult: Secondary | ICD-10-CM | POA: Diagnosis not present

## 2018-09-11 DIAGNOSIS — K5281 Eosinophilic gastritis or gastroenteritis: Secondary | ICD-10-CM | POA: Diagnosis not present

## 2018-09-11 DIAGNOSIS — R112 Nausea with vomiting, unspecified: Secondary | ICD-10-CM | POA: Diagnosis not present

## 2018-09-11 DIAGNOSIS — R109 Unspecified abdominal pain: Secondary | ICD-10-CM | POA: Diagnosis not present

## 2018-09-11 DIAGNOSIS — K639 Disease of intestine, unspecified: Secondary | ICD-10-CM | POA: Diagnosis not present

## 2018-09-20 ENCOUNTER — Other Ambulatory Visit: Payer: Self-pay | Admitting: Family Medicine

## 2018-09-20 DIAGNOSIS — Z7952 Long term (current) use of systemic steroids: Secondary | ICD-10-CM

## 2018-09-28 ENCOUNTER — Ambulatory Visit
Admission: RE | Admit: 2018-09-28 | Discharge: 2018-09-28 | Disposition: A | Discharge status: Home / Self Care | Payer: 59 | Source: Ambulatory Visit | Attending: Family Medicine | Admitting: Family Medicine

## 2018-09-28 DIAGNOSIS — Z7952 Long term (current) use of systemic steroids: Secondary | ICD-10-CM

## 2018-09-28 DIAGNOSIS — M810 Age-related osteoporosis without current pathological fracture: Secondary | ICD-10-CM | POA: Diagnosis not present

## 2018-10-05 DIAGNOSIS — R197 Diarrhea, unspecified: Secondary | ICD-10-CM | POA: Diagnosis not present

## 2018-10-05 DIAGNOSIS — Z1211 Encounter for screening for malignant neoplasm of colon: Secondary | ICD-10-CM | POA: Diagnosis not present

## 2018-10-05 DIAGNOSIS — K5281 Eosinophilic gastritis or gastroenteritis: Secondary | ICD-10-CM | POA: Diagnosis not present

## 2018-10-05 DIAGNOSIS — K3189 Other diseases of stomach and duodenum: Secondary | ICD-10-CM | POA: Diagnosis not present

## 2018-10-05 DIAGNOSIS — K449 Diaphragmatic hernia without obstruction or gangrene: Secondary | ICD-10-CM | POA: Diagnosis not present

## 2018-10-05 DIAGNOSIS — K648 Other hemorrhoids: Secondary | ICD-10-CM | POA: Diagnosis not present

## 2018-10-05 DIAGNOSIS — Z86010 Personal history of colonic polyps: Secondary | ICD-10-CM | POA: Diagnosis not present

## 2018-10-22 DIAGNOSIS — R634 Abnormal weight loss: Secondary | ICD-10-CM | POA: Diagnosis not present

## 2018-10-22 DIAGNOSIS — D721 Eosinophilia: Secondary | ICD-10-CM | POA: Diagnosis not present

## 2018-10-22 DIAGNOSIS — K639 Disease of intestine, unspecified: Secondary | ICD-10-CM | POA: Diagnosis not present

## 2018-10-24 DIAGNOSIS — M810 Age-related osteoporosis without current pathological fracture: Secondary | ICD-10-CM | POA: Diagnosis not present

## 2018-10-26 DIAGNOSIS — K5281 Eosinophilic gastritis or gastroenteritis: Secondary | ICD-10-CM | POA: Diagnosis not present

## 2018-12-21 DIAGNOSIS — K5281 Eosinophilic gastritis or gastroenteritis: Secondary | ICD-10-CM | POA: Diagnosis not present

## 2018-12-28 DIAGNOSIS — K3189 Other diseases of stomach and duodenum: Secondary | ICD-10-CM | POA: Diagnosis not present

## 2018-12-28 DIAGNOSIS — I890 Lymphedema, not elsewhere classified: Secondary | ICD-10-CM | POA: Diagnosis not present

## 2018-12-28 DIAGNOSIS — Z882 Allergy status to sulfonamides status: Secondary | ICD-10-CM | POA: Diagnosis not present

## 2018-12-28 DIAGNOSIS — K5281 Eosinophilic gastritis or gastroenteritis: Secondary | ICD-10-CM | POA: Diagnosis not present

## 2019-01-01 DIAGNOSIS — D721 Eosinophilia: Secondary | ICD-10-CM | POA: Diagnosis not present

## 2019-01-01 DIAGNOSIS — K9049 Malabsorption due to intolerance, not elsewhere classified: Secondary | ICD-10-CM | POA: Diagnosis not present

## 2019-01-01 DIAGNOSIS — R109 Unspecified abdominal pain: Secondary | ICD-10-CM | POA: Diagnosis not present

## 2019-01-01 DIAGNOSIS — R195 Other fecal abnormalities: Secondary | ICD-10-CM | POA: Diagnosis not present

## 2019-02-15 DIAGNOSIS — T781XXD Other adverse food reactions, not elsewhere classified, subsequent encounter: Secondary | ICD-10-CM | POA: Diagnosis not present

## 2019-02-15 DIAGNOSIS — J3089 Other allergic rhinitis: Secondary | ICD-10-CM | POA: Diagnosis not present

## 2019-02-15 DIAGNOSIS — K5281 Eosinophilic gastritis or gastroenteritis: Secondary | ICD-10-CM | POA: Diagnosis not present

## 2019-03-25 DIAGNOSIS — D801 Nonfamilial hypogammaglobulinemia: Secondary | ICD-10-CM | POA: Diagnosis not present

## 2019-03-25 DIAGNOSIS — K5281 Eosinophilic gastritis or gastroenteritis: Secondary | ICD-10-CM | POA: Diagnosis not present

## 2020-02-01 ENCOUNTER — Ambulatory Visit: Payer: 59

## 2020-08-20 ENCOUNTER — Other Ambulatory Visit: Payer: Self-pay | Admitting: Surgery

## 2020-08-20 DIAGNOSIS — K439 Ventral hernia without obstruction or gangrene: Secondary | ICD-10-CM

## 2020-08-20 DIAGNOSIS — R109 Unspecified abdominal pain: Secondary | ICD-10-CM

## 2020-08-24 ENCOUNTER — Other Ambulatory Visit: Payer: Self-pay | Admitting: Internal Medicine

## 2020-08-24 DIAGNOSIS — R5381 Other malaise: Secondary | ICD-10-CM

## 2020-09-04 ENCOUNTER — Ambulatory Visit
Admission: RE | Admit: 2020-09-04 | Discharge: 2020-09-04 | Disposition: A | Discharge status: Home / Self Care | Payer: 59 | Source: Ambulatory Visit | Attending: Surgery | Admitting: Surgery

## 2020-09-04 ENCOUNTER — Other Ambulatory Visit: Payer: Self-pay

## 2020-09-04 DIAGNOSIS — K439 Ventral hernia without obstruction or gangrene: Secondary | ICD-10-CM

## 2020-09-04 DIAGNOSIS — R109 Unspecified abdominal pain: Secondary | ICD-10-CM

## 2020-09-04 MED ORDER — IOPAMIDOL (ISOVUE-300) INJECTION 61%
100.0000 mL | Freq: Once | INTRAVENOUS | Status: AC | PRN
  Administered 2020-09-04: 100 mL via INTRAVENOUS

## 2020-09-10 ENCOUNTER — Other Ambulatory Visit: Payer: Self-pay | Admitting: Internal Medicine

## 2020-09-10 DIAGNOSIS — M810 Age-related osteoporosis without current pathological fracture: Secondary | ICD-10-CM

## 2020-09-10 DIAGNOSIS — Z7952 Long term (current) use of systemic steroids: Secondary | ICD-10-CM

## 2020-09-22 ENCOUNTER — Other Ambulatory Visit: Payer: 59

## 2020-09-23 ENCOUNTER — Other Ambulatory Visit: Payer: Self-pay | Admitting: Surgery

## 2020-09-23 ENCOUNTER — Other Ambulatory Visit: Payer: Self-pay | Admitting: Internal Medicine

## 2020-11-09 ENCOUNTER — Ambulatory Visit
Admission: RE | Admit: 2020-11-09 | Discharge: 2020-11-09 | Disposition: A | Discharge status: Home / Self Care | Payer: 59 | Source: Ambulatory Visit | Attending: Internal Medicine | Admitting: Internal Medicine

## 2020-11-09 ENCOUNTER — Other Ambulatory Visit: Payer: Self-pay

## 2020-11-09 DIAGNOSIS — M810 Age-related osteoporosis without current pathological fracture: Secondary | ICD-10-CM

## 2020-11-09 DIAGNOSIS — Z7952 Long term (current) use of systemic steroids: Secondary | ICD-10-CM

## 2022-06-15 IMAGING — CT CT ABD-PELV W/ CM
1 of 3 series · 13 of 32 positions shown, 18 images · IV contrast (APPLIED)
Comparison: August 19, 2013

CLINICAL DATA: Abdominal pain.  Ventral hernia.

EXAM:
CT ABDOMEN AND PELVIS WITH CONTRAST
TECHNIQUE: Multidetector CT imaging of the abdomen and pelvis was performed
using the standard protocol following bolus administration of
intravenous contrast.
CONTRAST:  100mL [id number] IOPAMIDOL ([id number]) INJECTION 61%

[Series 2: abd/pelvis w/cm · axial · 0.65mm/px · z∈[-477,-87]mm · 13 of 92 slices shown, 18 images]
[im 7/92  soft-tissue]
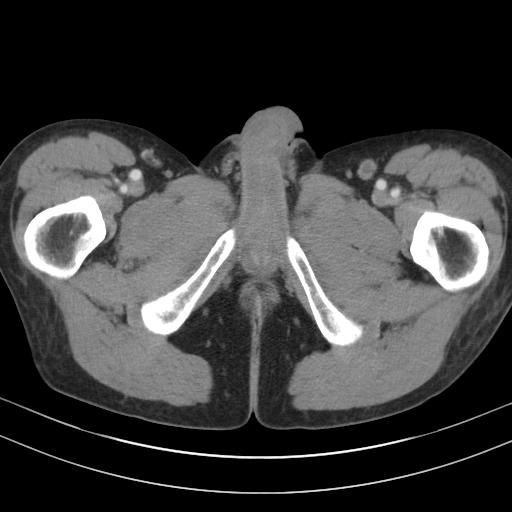
[im 7/92  bone]
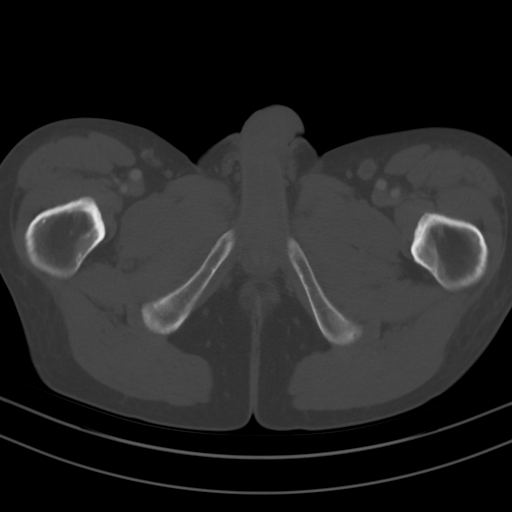
[im 13/92  soft-tissue]
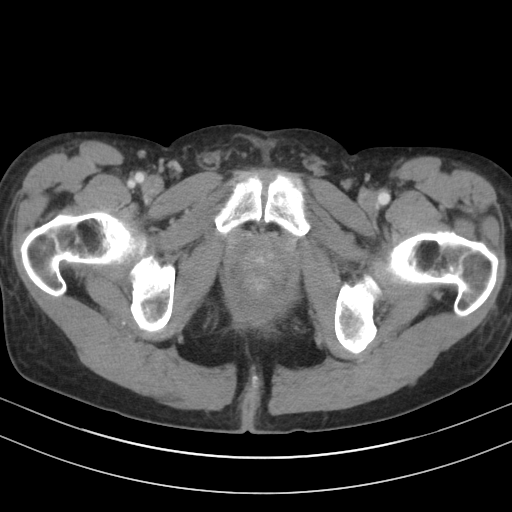
[im 19/92  soft-tissue]
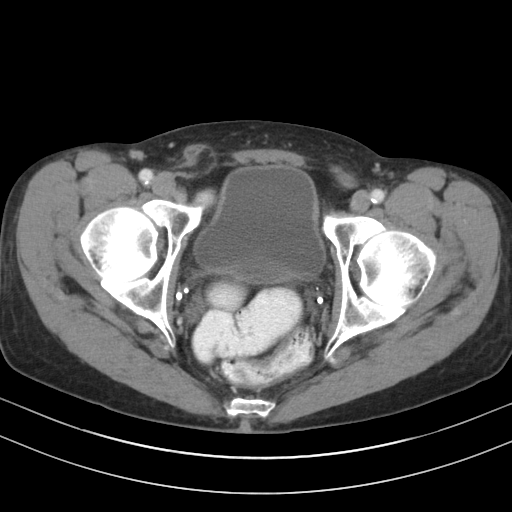
[im 31/92  soft-tissue]
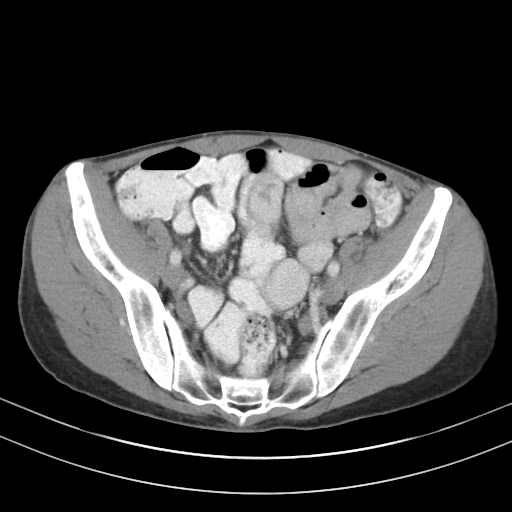
[im 37/92  soft-tissue]
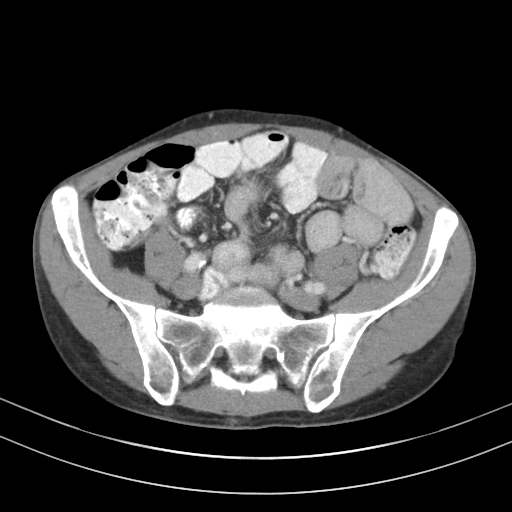
[im 43/92  soft-tissue]
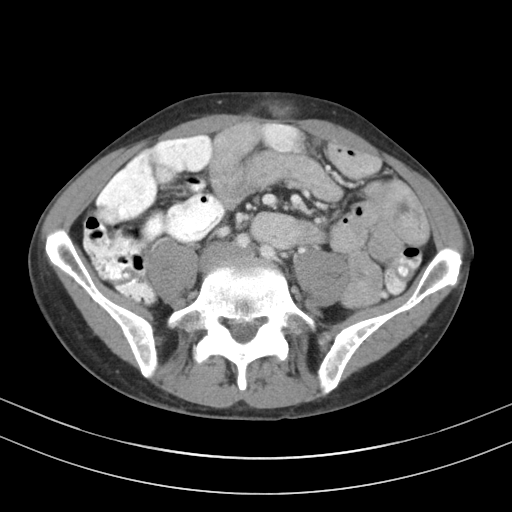
[im 49/92  soft-tissue]
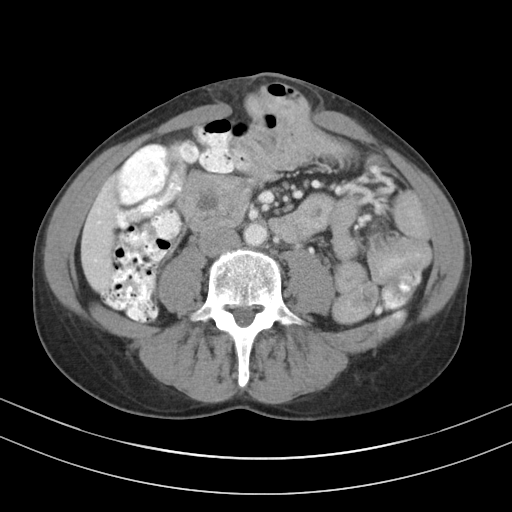
[im 55/92  soft-tissue]
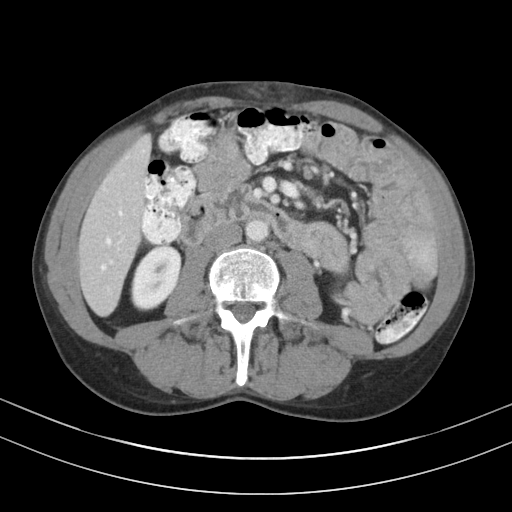
[im 61/92  soft-tissue]
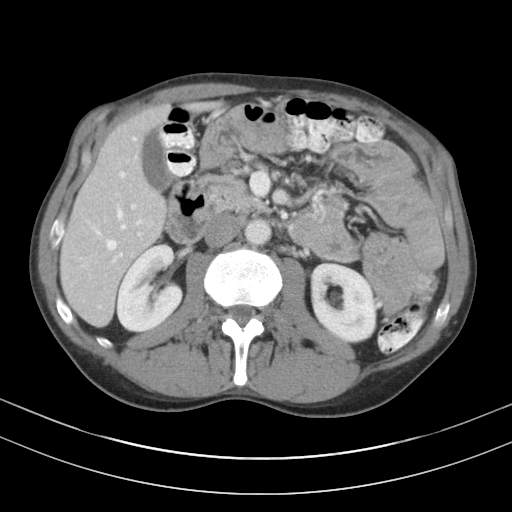
[im 61/92  bone]
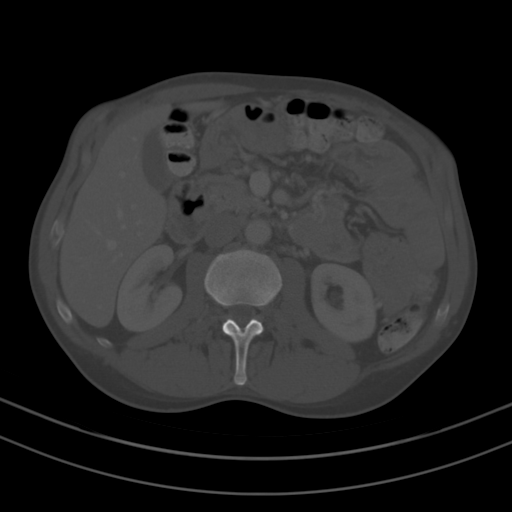
[im 67/92  lung]
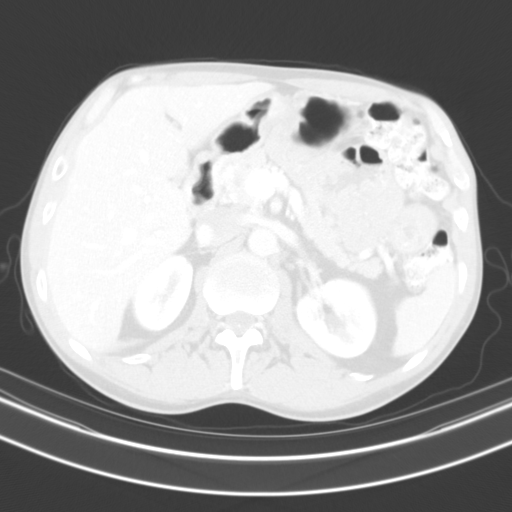
[im 73/92  soft-tissue]
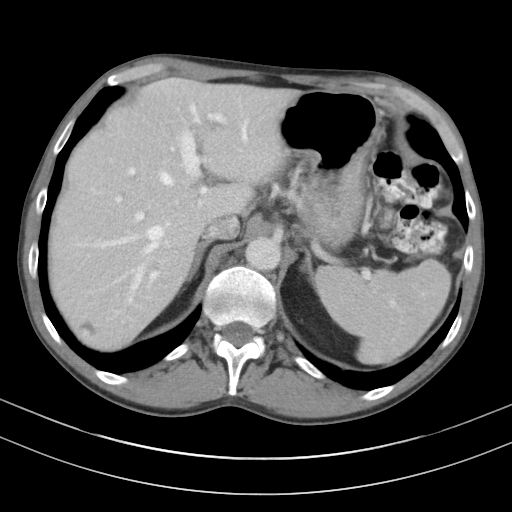
[im 73/92  lung]
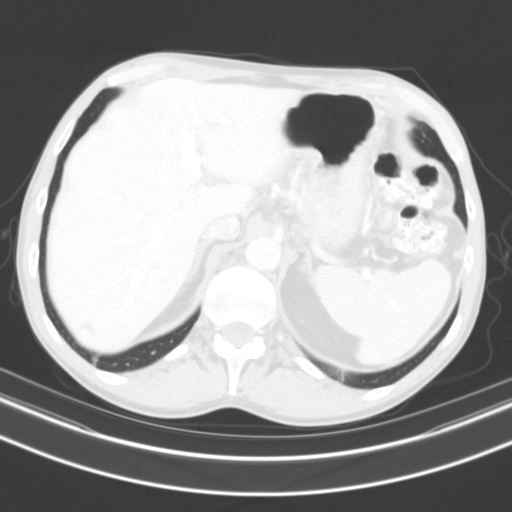
[im 79/92  soft-tissue]
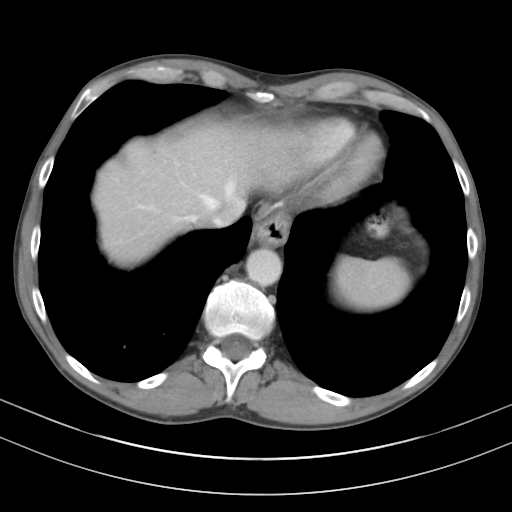
[im 79/92  lung]
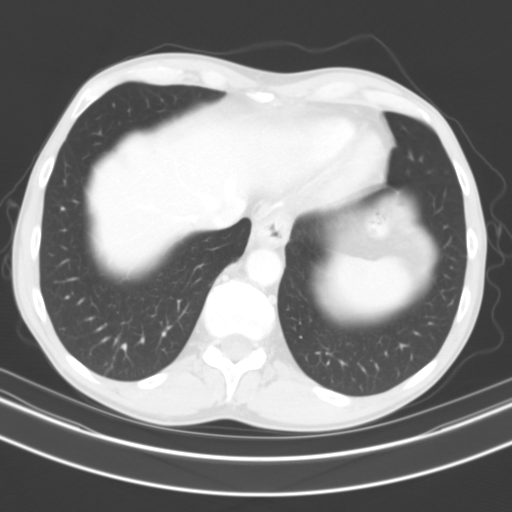
[im 85/92  soft-tissue]
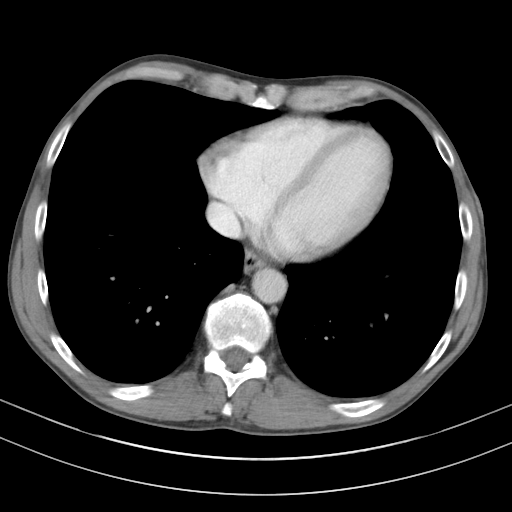
[im 85/92  lung]
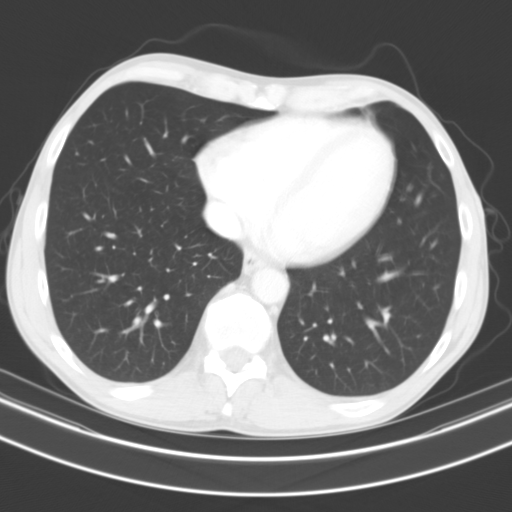

[13 of 32 positions shown; findings below may reference images not displayed]

FINDINGS: Lower chest: The lung bases are clear. The heart size is normal.

Hepatobiliary: There are subtle small hypoattenuating liver lesions
which are too small to characterize but statistically are most
likely to represent benign cysts. There is an indeterminate 1.4 cm
lesion in hepatic segment 7 (axial series 2, image 19). This was not
well appreciated on the patient's prior CT from 3996. However, it
has imaging characteristics that are suggestive of a hepatic
hemangioma. Normal gallbladder.There is no biliary ductal dilation.

Pancreas: Normal contours without ductal dilatation. No
peripancreatic fluid collection.

Spleen: Unremarkable.

Adrenals/Urinary Tract:

--Adrenal glands: Unremarkable.

--Right kidney/ureter: No hydronephrosis or radiopaque kidney
stones.

--Left kidney/ureter: No hydronephrosis or radiopaque kidney stones.

--Urinary bladder: Unremarkable.

Stomach/Bowel:

--Stomach/Duodenum: No hiatal hernia or other gastric abnormality.
Normal duodenal course and caliber.

--Small bowel: Unremarkable.

--Colon: Unremarkable.

--Appendix: Normal.

Vascular/Lymphatic: Atherosclerotic calcification is present within
the non-aneurysmal abdominal aorta, without hemodynamically
significant stenosis.

--No retroperitoneal lymphadenopathy.

--No mesenteric lymphadenopathy.

--No pelvic or inguinal lymphadenopathy.

Reproductive: The prostate gland is enlarged.

Other: No ascites or free air. There is a wide-mouth ventral hernia.
The abdominal wall defect measures approximately 5.2 cm across. This
hernia contains both fat and loops of small bowel without evidence
for an obstruction. This hernia is at the level of the umbilicus.

Musculoskeletal. No acute displaced fractures.
IMPRESSION: 1. Wide-mouth ventral hernia containing both fat and loops of small
bowel without evidence for an obstruction.
2. Indeterminate 1.4 cm lesion in hepatic segment 7. This was not
well appreciated on the patient's prior CT from 3996. However, it
has imaging characteristics that are suggestive of a hepatic
hemangioma. Follow-up with a nonemergent ultrasound is recommended.
3. Enlarged prostate gland.

Aortic Atherosclerosis ([id number]).
